# Patient Record
Sex: Female | Born: 1960
Health system: Southern US, Community
[De-identification: ages and names within clinical notes are randomized; demographics above are authoritative.]

## PROBLEM LIST (undated history)

## (undated) DIAGNOSIS — I739 Peripheral vascular disease, unspecified: Secondary | ICD-10-CM

## (undated) DIAGNOSIS — F1011 Alcohol abuse, in remission: Secondary | ICD-10-CM

## (undated) DIAGNOSIS — F1911 Other psychoactive substance abuse, in remission: Secondary | ICD-10-CM

## (undated) DIAGNOSIS — E7800 Pure hypercholesterolemia, unspecified: Secondary | ICD-10-CM

## (undated) DIAGNOSIS — E119 Type 2 diabetes mellitus without complications: Secondary | ICD-10-CM

## (undated) DIAGNOSIS — J449 Chronic obstructive pulmonary disease, unspecified: Secondary | ICD-10-CM

## (undated) DIAGNOSIS — N289 Disorder of kidney and ureter, unspecified: Secondary | ICD-10-CM

## (undated) DIAGNOSIS — I10 Essential (primary) hypertension: Secondary | ICD-10-CM

## (undated) DIAGNOSIS — J4489 Other specified chronic obstructive pulmonary disease: Secondary | ICD-10-CM

## (undated) DIAGNOSIS — I509 Heart failure, unspecified: Secondary | ICD-10-CM

---

## 2013-08-02 DIAGNOSIS — E785 Hyperlipidemia, unspecified: Secondary | ICD-10-CM | POA: Insufficient documentation

## 2013-08-02 DIAGNOSIS — I10 Essential (primary) hypertension: Secondary | ICD-10-CM | POA: Insufficient documentation

## 2013-08-02 DIAGNOSIS — I2510 Atherosclerotic heart disease of native coronary artery without angina pectoris: Secondary | ICD-10-CM | POA: Insufficient documentation

## 2013-08-02 DIAGNOSIS — E119 Type 2 diabetes mellitus without complications: Secondary | ICD-10-CM | POA: Insufficient documentation

## 2015-04-27 DIAGNOSIS — J449 Chronic obstructive pulmonary disease, unspecified: Secondary | ICD-10-CM | POA: Insufficient documentation

## 2015-11-05 DIAGNOSIS — F1911 Other psychoactive substance abuse, in remission: Secondary | ICD-10-CM | POA: Insufficient documentation

## 2015-11-05 DIAGNOSIS — E669 Obesity, unspecified: Secondary | ICD-10-CM | POA: Insufficient documentation

## 2016-01-13 DIAGNOSIS — F17210 Nicotine dependence, cigarettes, uncomplicated: Secondary | ICD-10-CM | POA: Insufficient documentation

## 2016-03-19 ENCOUNTER — Encounter (HOSPITAL_BASED_OUTPATIENT_CLINIC_OR_DEPARTMENT_OTHER): Payer: Self-pay | Admitting: *Deleted

## 2016-03-19 ENCOUNTER — Emergency Department (HOSPITAL_BASED_OUTPATIENT_CLINIC_OR_DEPARTMENT_OTHER): Payer: Medicaid Other

## 2016-03-19 ENCOUNTER — Emergency Department (HOSPITAL_BASED_OUTPATIENT_CLINIC_OR_DEPARTMENT_OTHER)
Admission: EM | Admit: 2016-03-19 | Discharge: 2016-03-19 | Disposition: A | Discharge status: Home / Self Care | Payer: Medicaid Other | Attending: Emergency Medicine | Admitting: Emergency Medicine

## 2016-03-19 DIAGNOSIS — R51 Headache: Secondary | ICD-10-CM | POA: Insufficient documentation

## 2016-03-19 DIAGNOSIS — Z79899 Other long term (current) drug therapy: Secondary | ICD-10-CM | POA: Diagnosis not present

## 2016-03-19 DIAGNOSIS — Z7982 Long term (current) use of aspirin: Secondary | ICD-10-CM | POA: Diagnosis not present

## 2016-03-19 DIAGNOSIS — H9201 Otalgia, right ear: Secondary | ICD-10-CM | POA: Diagnosis present

## 2016-03-19 DIAGNOSIS — J449 Chronic obstructive pulmonary disease, unspecified: Secondary | ICD-10-CM | POA: Diagnosis not present

## 2016-03-19 DIAGNOSIS — E119 Type 2 diabetes mellitus without complications: Secondary | ICD-10-CM | POA: Diagnosis not present

## 2016-03-19 DIAGNOSIS — Z7984 Long term (current) use of oral hypoglycemic drugs: Secondary | ICD-10-CM | POA: Insufficient documentation

## 2016-03-19 DIAGNOSIS — F17200 Nicotine dependence, unspecified, uncomplicated: Secondary | ICD-10-CM | POA: Diagnosis not present

## 2016-03-19 DIAGNOSIS — I10 Essential (primary) hypertension: Secondary | ICD-10-CM | POA: Insufficient documentation

## 2016-03-19 HISTORY — DX: Peripheral vascular disease, unspecified: I73.9

## 2016-03-19 HISTORY — DX: Other specified chronic obstructive pulmonary disease: J44.89

## 2016-03-19 HISTORY — DX: Chronic obstructive pulmonary disease, unspecified: J44.9

## 2016-03-19 HISTORY — DX: Pure hypercholesterolemia, unspecified: E78.00

## 2016-03-19 HISTORY — DX: Type 2 diabetes mellitus without complications: E11.9

## 2016-03-19 HISTORY — DX: Other psychoactive substance abuse, in remission: F19.11

## 2016-03-19 HISTORY — DX: Essential (primary) hypertension: I10

## 2016-03-19 HISTORY — DX: Alcohol abuse, in remission: F10.11

## 2016-03-19 LAB — CBC WITH DIFFERENTIAL/PLATELET
Basophils Absolute: 0 10*3/uL (ref 0.0–0.1)
Basophils Relative: 0 %
Eosinophils Absolute: 0.3 10*3/uL (ref 0.0–0.7)
Eosinophils Relative: 3 %
HCT: 35.6 % — ABNORMAL LOW (ref 36.0–46.0)
Hemoglobin: 11.9 g/dL — ABNORMAL LOW (ref 12.0–15.0)
Lymphocytes Relative: 46 %
Lymphs Abs: 3.9 10*3/uL (ref 0.7–4.0)
MCH: 28 pg (ref 26.0–34.0)
MCHC: 33.4 g/dL (ref 30.0–36.0)
MCV: 83.8 fL (ref 78.0–100.0)
Monocytes Absolute: 0.5 10*3/uL (ref 0.1–1.0)
Monocytes Relative: 6 %
Neutro Abs: 3.8 10*3/uL (ref 1.7–7.7)
Neutrophils Relative %: 45 %
Platelets: 322 10*3/uL (ref 150–400)
RBC: 4.25 MIL/uL (ref 3.87–5.11)
RDW: 14.1 % (ref 11.5–15.5)
WBC: 8.5 10*3/uL (ref 4.0–10.5)

## 2016-03-19 LAB — BASIC METABOLIC PANEL
Anion gap: 6 (ref 5–15)
BUN: 25 mg/dL — ABNORMAL HIGH (ref 6–20)
CO2: 22 mmol/L (ref 22–32)
Calcium: 9.4 mg/dL (ref 8.9–10.3)
Chloride: 108 mmol/L (ref 101–111)
Creatinine, Ser: 1.07 mg/dL — ABNORMAL HIGH (ref 0.44–1.00)
GFR calc Af Amer: >60 mL/min (ref >60)
GFR calc non Af Amer: 58 mL/min — ABNORMAL LOW (ref >60)
Glucose, Bld: 252 mg/dL — ABNORMAL HIGH (ref 65–99)
Potassium: 4 mmol/L (ref 3.5–5.1)
Sodium: 136 mmol/L (ref 135–145)

## 2016-03-19 MED ORDER — IOPAMIDOL (ISOVUE-300) INJECTION 61%
75.0000 mL | Freq: Once | INTRAVENOUS | Status: AC | PRN
  Administered 2016-03-19: 75 mL via INTRAVENOUS

## 2016-03-19 MED ORDER — AMOXICILLIN-POT CLAVULANATE 875-125 MG PO TABS: 1.0000 | ORAL_TABLET | Freq: Two times a day (BID) | ORAL | 0 refills | Status: DC

## 2016-03-19 MED ORDER — OXYCODONE-ACETAMINOPHEN 5-325 MG PO TABS: 1.0000 | ORAL_TABLET | Freq: Once | ORAL | Status: DC

## 2016-03-19 MED ORDER — IBUPROFEN 800 MG PO TABS
800.0000 mg | ORAL_TABLET | Freq: Once | ORAL | Status: AC
  Administered 2016-03-19: 800 mg via ORAL
  Filled 2016-03-19: qty 1

## 2016-03-19 NOTE — ED Triage Notes (Signed)
Pt reports R ear pain, intermittent blurry vision (R eye), intermittent dizziness and lightheadedness x1wk. Reports seeing PCP this past Wed -- states he told her it was an ear infection that was likely pressing on optic nerve. States he gave her ear drops but that her symptoms have worsened since then. Denies fever, n/v/d, nasal congestion/discharge. Reports sore throat.

## 2016-03-19 NOTE — Discharge Instructions (Signed)
Please take antibiotic as prescribed. Please follow up with your primary care doctor this week. You also need your kidney function rechecked by your primary care doctor. Return to the ED if you symptoms worsen or if your vision gets worse.

## 2016-03-19 NOTE — ED Provider Notes (Signed)
[facility]   CSN: [id number] Arrival date & time: 03/19/16  7829     History   Chief Complaint Chief Complaint  Patient presents with  . Otalgia    HPI Kathryn Dillon is a 55 y.o. female.  55 year old African-American female past history significant for COPD, diabetes, hypertension presents to the ED this morning with right ear pain. Patient states that she was diagnosed with otitis media approximately 4 days ago by PCP. She was given antibiotic drops. However she states that the pain has gotten significantly worse this morning. She is unable to sleep at night due to the pain. Patient states the right ear pain as been ongoing for 10 days. Nothing makes better or worse. She also endorses intermittent right blurry vision. Along with intermittent dizziness and lightheadedness for the past 10 days since the otalgia started. Denies vertigo like symptoms. She also reports a mild constant right sided headache. Patient endorses right maxillary and frontal sinus pressure. She states she thought it was allergies. Endorses mild sore throat. Denies any fever, chills, rhinorrhea, ear discharge, or hearing loss, cough, chest congestion, CP, SOB, nausea, emesis, abd pain, urinary symptoms, change in bowel habits, numbness/tingling,       Past Medical History:  Diagnosis Date  . COPD (chronic obstructive pulmonary disease) with chronic bronchitis (HCC)   . Diabetes mellitus without complication (HCC)   . H/O drug abuse   . H/O ETOH abuse   . High cholesterol   . Hypertension   . PAD (peripheral artery disease) (HCC)     There are no active problems to display for this patient.   History reviewed. No pertinent surgical history.  OB History    No data available       Home Medications    Prior to Admission medications   Medication Sig Start Date End Date Taking? Authorizing Provider  AMLODIPINE BESYLATE PO Take 5 mg by mouth daily.   Yes Historical Provider,  MD  aspirin EC 81 MG tablet Take 81 mg by mouth daily.   Yes Historical Provider, MD  atorvastatin (LIPITOR) 20 MG tablet Take 20 mg by mouth daily.   Yes Historical Provider, MD  cilostazol (PLETAL) 50 MG tablet Take 100 mg by mouth 2 (two) times daily.   Yes Historical Provider, MD  ezetimibe (ZETIA) 10 MG tablet Take 10 mg by mouth daily.   Yes Historical Provider, MD  gabapentin (NEURONTIN) 300 MG capsule Take 300 mg by mouth as needed (1-4 times daily).   Yes Historical Provider, MD  hydrochlorothiazide (HYDRODIURIL) 25 MG tablet Take 25 mg by mouth daily.   Yes Historical Provider, MD  loratadine (CLARITIN) 10 MG tablet Take 10 mg by mouth daily.   Yes Historical Provider, MD  losartan (COZAAR) 50 MG tablet Take 50 mg by mouth daily.   Yes Historical Provider, MD  metFORMIN (GLUCOPHAGE) 1000 MG tablet Take 1,000 mg by mouth 2 (two) times daily with a meal.   Yes Historical Provider, MD  sertraline (ZOLOFT) 25 MG tablet Take 25 mg by mouth daily.   Yes Historical Provider, MD    Family History No family history on file.  Social History Social History  Substance Use Topics  . Smoking status: Current Every Day Smoker  . Smokeless tobacco: Never Used  . Alcohol use No     Allergies   Lisinopril   Review of Systems Review of Systems  Constitutional: Negative for chills and fever.  HENT: Positive for congestion, ear  pain, sinus pressure and sore throat. Negative for ear discharge, facial swelling, hearing loss and rhinorrhea.   Eyes: Negative for pain and visual disturbance.  Respiratory: Negative for cough and shortness of breath.   Cardiovascular: Negative for chest pain and palpitations.  Gastrointestinal: Negative for abdominal pain, diarrhea, nausea and vomiting.  Genitourinary: Negative for dysuria, frequency, hematuria and urgency.  Musculoskeletal: Negative for arthralgias and back pain.  Skin: Negative for color change and rash.  Neurological: Positive for  light-headedness and headaches. Negative for dizziness, syncope, weakness and numbness.  All other systems reviewed and are negative.    Physical Exam Updated Vital Signs BP 127/74 (BP Location: Right Arm)   Pulse 83   Temp 98.7 F (37.1 C) (Oral)   Resp 18   Ht 5\' 4"  (1.626 m)   Wt 108 kg   SpO2 98%   BMI 40.85 kg/m   Physical Exam  Constitutional: She appears well-developed and well-nourished. No distress.  HENT:  Head: Normocephalic and atraumatic.  Right Ear: Hearing, tympanic membrane and external ear normal. There is swelling and tenderness. No drainage.  Left Ear: Hearing, tympanic membrane, external ear and ear canal normal.  Nose: Right sinus exhibits maxillary sinus tenderness and frontal sinus tenderness. Left sinus exhibits frontal sinus tenderness. Left sinus exhibits no maxillary sinus tenderness.  Mouth/Throat: Oropharynx is clear and moist.  Minimal swelling around right ear, with tenderness to palpation behind ear, slight mastoid tenderness  Eyes: Conjunctivae and EOM are normal. Pupils are equal, round, and reactive to light. Right eye exhibits no discharge. Left eye exhibits no discharge. No scleral icterus.  No proptosis, no temporal tenderness, peripheral vision intact. EOM intact.   Neck: Normal range of motion. Neck supple. No thyromegaly present.  Cardiovascular: Normal rate, regular rhythm, normal heart sounds and intact distal pulses.   Pulmonary/Chest: Effort normal and breath sounds normal.  Abdominal: Soft. Bowel sounds are normal. She exhibits no distension. There is no tenderness. There is no guarding.  Musculoskeletal: Normal range of motion.  Lymphadenopathy:    She has cervical adenopathy.  Neurological: She is alert. She has normal strength. No cranial nerve deficit or sensory deficit.  Skin: Skin is warm and dry.  Vitals reviewed.    Visual Acuity  Right Eye Distance: 20/25 Left Eye Distance: 20/25 Bilateral Distance: 20/20  Right Eye  Near:   Left Eye Near:    Bilateral Near:      ED Treatments / Results  Labs (all labs ordered are listed, but only abnormal results are displayed) Labs Reviewed  BASIC METABOLIC PANEL - Abnormal; Notable for the following:       Result Value   Glucose, Bld 252 (*)    BUN 25 (*)    Creatinine, Ser 1.07 (*)    GFR calc non Af Amer 58 (*)    All other components within normal limits  CBC WITH DIFFERENTIAL/PLATELET - Abnormal; Notable for the following:    Hemoglobin 11.9 (*)    HCT 35.6 (*)    All other components within normal limits    EKG  EKG Interpretation None       Radiology Ct Head W Or Wo Contrast  Result Date: 03/19/2016 CLINICAL DATA:  RIGHT ear pain, dizziness, and headache. Symptoms for 1 week. EXAM: CT HEAD WITHOUT AND WITH CONTRAST TECHNIQUE: Contiguous axial images were obtained from the base of the skull through the vertex without and with intravenous contrast CONTRAST:  75mL ISOVUE-300 IOPAMIDOL (ISOVUE-300) INJECTION 61% COMPARISON:  None.  FINDINGS: Brain: No evidence for acute infarction, hemorrhage, mass lesion, hydrocephalus, or extra-axial fluid. Normal cerebral volume. Hypoattenuation of white matter suggesting chronic microvascular ischemic change. Post infusion, no abnormal enhancement of the brain or visible meninges. Partial empty sella. Vascular: No hyperdense vessel. Mild vascular calcification in the carotid siphons. Skull: No worrisome osseous lesion.  No skull fracture. Sinuses/Orbits: Unremarkable Other: No temporal bone inflammatory process is evident. IMPRESSION: Negative exam. No acute intracranial findings. No abnormal postcontrast enhancement. Mild chronic microvascular ischemic change, likely sequelae of hypertension and/or diabetes. Electronically Signed   By: Elsie Stain M.D.   On: 03/19/2016 11:04   Ct Soft Tissue Neck W Contrast  Result Date: 03/19/2016 CLINICAL DATA:  RIGHT ear pain and dizziness.  Symptoms for 1 week. EXAM: CT NECK  WITH CONTRAST TECHNIQUE: Multidetector CT imaging of the neck was performed using the standard protocol following the bolus administration of intravenous contrast. CONTRAST:  75mL ISOVUE-300 IOPAMIDOL (ISOVUE-300) INJECTION 61% COMPARISON:  CT head reported separately. FINDINGS: Pharynx and larynx: Normal. No mass or swelling. Salivary glands: No inflammation, mass, or stone. Thyroid: Normal. Lymph nodes: None enlarged or abnormal density. Vascular: Negative. Limited intracranial: Negative. Visualized orbits: Negative. Mastoids and visualized paranasal sinuses: Clear. Skeleton: No acute or aggressive process. Upper chest: Negative. Other: None. IMPRESSION: Unremarkable CT neck. Electronically Signed   By: Elsie Stain M.D.   On: 03/19/2016 10:59    Procedures Procedures (including critical care time)  Medications Ordered in ED Medications  oxyCODONE-acetaminophen (PERCOCET/ROXICET) 5-325 MG per tablet 1 tablet (not administered)     Initial Impression / Assessment and Plan / ED Course  I have reviewed the triage vital signs and the nursing notes.  Pertinent labs & imaging results that were available during my care of the patient were reviewed by me and considered in my medical decision making (see chart for details).  Clinical Course  Patient presented to ED with right ear pain, dizziness, and blurry vision. Patient has failed outpatient abx therapy. Concern for mastoiditis given history of DM. Also concern for cavernous sinus thrombosis given her blurry vision. Low suspicion for temporal arteritis given age, no temporal tenderness, normal visual acuity and length of symptoms. CT scan performed was unremarkable and showed not acute findings. Labs unremarkable. Patient without leukocytosis. Creatine mildly elevated. Encouraged patient to follow up with pcp for recheck. Dizziness likely due to mild dehydration. Encouraged po intake. Patient is not toxic appearing. And pain was treated in ED. Likely  AOM. Will treat with Augmentin. Patient encouraged to follow up with PCP if symptoms persists. Discussed plan of care with Dr. Clayborne Dana who saw and examined patient and agrees with plan. Hemodynamically stable. Discharged home in NAD with stable vs. Patient verbalized understanding with plan of care.  Final Clinical Impressions(s) / ED Diagnoses   Final diagnoses:  Otalgia of right ear    New Prescriptions Discharge Medication List as of 03/19/2016 12:11 PM    START taking these medications   Details  amoxicillin-clavulanate (AUGMENTIN) 875-125 MG tablet Take 1 tablet by mouth 2 (two) times daily., Starting Sat 03/19/2016, Print         Rise Mu, PA-C 03/21/16 1610    Marily Memos, MD 03/21/16 1102

## 2016-03-19 NOTE — ED Notes (Signed)
PA-C at bedside 

## 2016-03-19 NOTE — ED Provider Notes (Signed)
Medical screening examination/treatment/procedure(s) were conducted as a shared visit with non-physician practitioner(s) and myself.  I personally evaluated the patient during the encounter.  55 yo F w/ h/o DM here w/ 10 days of right ear pain, now with swelling and increasing pain. Also with blurry vision. Seen by PCP, given drops without relief.  Here with pain, tenderness around ear, no proptosis. TM with some erythema, minimal fluid.  Likely AOM, but not impressive presentation, so further on differential includes mastoiditis and cavernous sinus thrombosis secondary to blurry vision. I think these are low likelihood so we will do CT scan and if it is absolutely normal I will not pursue MRI and likely just start antibiotics.   Marily MemosJason Zarie Kosiba, MD 03/19/16 [id number]

## 2016-03-23 DIAGNOSIS — I70213 Atherosclerosis of native arteries of extremities with intermittent claudication, bilateral legs: Secondary | ICD-10-CM | POA: Insufficient documentation

## 2016-03-30 ENCOUNTER — Encounter: Payer: Self-pay | Admitting: Podiatry

## 2016-03-30 ENCOUNTER — Ambulatory Visit (INDEPENDENT_AMBULATORY_CARE_PROVIDER_SITE_OTHER): Payer: Medicaid Other | Admitting: Podiatry

## 2016-03-30 VITALS — BP 137/87 | HR 93 | Ht 64.0 in | Wt 242.0 lb

## 2016-03-30 DIAGNOSIS — I739 Peripheral vascular disease, unspecified: Secondary | ICD-10-CM

## 2016-03-30 DIAGNOSIS — M79673 Pain in unspecified foot: Secondary | ICD-10-CM | POA: Diagnosis not present

## 2016-03-30 DIAGNOSIS — B351 Tinea unguium: Secondary | ICD-10-CM | POA: Diagnosis not present

## 2016-03-30 DIAGNOSIS — L600 Ingrowing nail: Secondary | ICD-10-CM | POA: Insufficient documentation

## 2016-03-30 NOTE — Progress Notes (Addendum)
SUBJECTIVE: 55 y.o. year old female presents for diabetic foot care. Patient request toe nails trimmed. They are real sore on both big toes from ingrown nails. Also wants to have diabetic shoes. Diagnosed with diabetic in 2006. Blood sugar was 111 this morning. Last A1C was 9 last month. Patient is ambulatory without assistance.  She walks 30 minutes daily but gets pain in calf after a few blocks.   HPI: Has blockage in both leg since early 2017. In process of making arrangement for possible reconstructive procedure. Also numbness, tingling and stinging on toes and bottom of feet.  REVIEW OF SYSTEMS: Constitutional: negative for chills, fatigue, fevers, night sweats and weight loss Eyes: negative Ears, nose, mouth, throat, and face: negative Respiratory: negative, COPD, uses O2 and Nebulizer. Cardiovascular: Hypertension. Gastrointestinal: negative Genitourinary:negative Hematologic/lymphatic: negative Musculoskeletal:negative Neurological: Subjective numbness, tingling, and burning sensation on toes and feet bilateral.  Endocrine: IDDM. Allergic/Immunologic: negative  OBJECTIVE: DERMATOLOGIC EXAMINATION: Thick hypertrophic nails x 10. Symptomatic ingrown nails on both great toes.  VASCULAR EXAMINATION OF LOWER LIMBS: Pedal pulses are not palpable bilateral.  Temperature gradient from tibial crest to dorsum of foot is within normal bilateral. No sign of ischemic skin change noted.  NEUROLOGIC EXAMINATION OF THE LOWER LIMBS: Achilles DTR is present and within normal. Monofilament (Semmes-Weinstein 10-gm) sensory testing show diminished response. Failed to respond in lesser digits, 1 out of 5 on each foot. 2. Vibratory sensations(128Hz  turning fork) intact at medial and lateral forefoot bilateral.  Sharp and Dull discriminatory sensations at the plantar ball of hallux is intact bilateral.   MUSCULOSKELETAL EXAMINATION: No gross deformities noted. Pain in calf after walking a  few blocks away bilateral.  ASSESSMENT: Painful ingrown nails on both great toes. Mycotic nails x 10. PVD lower limbs with claudication. Uncontrolled IDDM.  PLAN: Reviewed clinical findings and available options. All nails debrided. Pain was relieved. Reviewed importance of the vascular work up that is scheduled in near future with his vascular doctor.  Continue with daily walking exercise to promote blood flow in lower limbs. Diabetic shoe form dispensed for him to take to PCP for certification.  Return in 3 months or as needed.

## 2016-03-30 NOTE — Patient Instructions (Addendum)
Seen for painful toe nails on both great toes and diabetic shoes. Currently both lower limbs have compromised blood flow and Peripheral neuropathy.  All affected nails debrided. Continue with daily walking exercise.  May benefit from diabetic shoes.  Return in 3 months or sooner if the nails become painful again.

## 2016-06-29 ENCOUNTER — Ambulatory Visit: Payer: Medicaid Other | Admitting: Podiatry

## 2016-07-18 ENCOUNTER — Encounter (HOSPITAL_BASED_OUTPATIENT_CLINIC_OR_DEPARTMENT_OTHER): Payer: Self-pay | Admitting: *Deleted

## 2016-07-18 ENCOUNTER — Emergency Department (HOSPITAL_BASED_OUTPATIENT_CLINIC_OR_DEPARTMENT_OTHER)
Admission: EM | Admit: 2016-07-18 | Discharge: 2016-07-18 | Disposition: A | Discharge status: Home / Self Care | Payer: No Typology Code available for payment source | Attending: Emergency Medicine | Admitting: Emergency Medicine

## 2016-07-18 ENCOUNTER — Emergency Department (HOSPITAL_BASED_OUTPATIENT_CLINIC_OR_DEPARTMENT_OTHER): Payer: No Typology Code available for payment source

## 2016-07-18 DIAGNOSIS — Z7984 Long term (current) use of oral hypoglycemic drugs: Secondary | ICD-10-CM | POA: Diagnosis not present

## 2016-07-18 DIAGNOSIS — Y92410 Unspecified street and highway as the place of occurrence of the external cause: Secondary | ICD-10-CM | POA: Diagnosis not present

## 2016-07-18 DIAGNOSIS — Z79899 Other long term (current) drug therapy: Secondary | ICD-10-CM | POA: Insufficient documentation

## 2016-07-18 DIAGNOSIS — Y9389 Activity, other specified: Secondary | ICD-10-CM | POA: Diagnosis not present

## 2016-07-18 DIAGNOSIS — E119 Type 2 diabetes mellitus without complications: Secondary | ICD-10-CM | POA: Diagnosis not present

## 2016-07-18 DIAGNOSIS — S4992XA Unspecified injury of left shoulder and upper arm, initial encounter: Secondary | ICD-10-CM | POA: Diagnosis present

## 2016-07-18 DIAGNOSIS — S8002XA Contusion of left knee, initial encounter: Secondary | ICD-10-CM | POA: Diagnosis not present

## 2016-07-18 DIAGNOSIS — J449 Chronic obstructive pulmonary disease, unspecified: Secondary | ICD-10-CM | POA: Diagnosis not present

## 2016-07-18 DIAGNOSIS — Y999 Unspecified external cause status: Secondary | ICD-10-CM | POA: Diagnosis not present

## 2016-07-18 DIAGNOSIS — Z7982 Long term (current) use of aspirin: Secondary | ICD-10-CM | POA: Diagnosis not present

## 2016-07-18 DIAGNOSIS — S42145A Nondisplaced fracture of glenoid cavity of scapula, left shoulder, initial encounter for closed fracture: Secondary | ICD-10-CM

## 2016-07-18 DIAGNOSIS — F17200 Nicotine dependence, unspecified, uncomplicated: Secondary | ICD-10-CM | POA: Diagnosis not present

## 2016-07-18 DIAGNOSIS — I10 Essential (primary) hypertension: Secondary | ICD-10-CM | POA: Insufficient documentation

## 2016-07-18 MED ORDER — HYDROCODONE-ACETAMINOPHEN 5-325 MG PO TABS
1.0000 | ORAL_TABLET | Freq: Four times a day (QID) | ORAL | 0 refills | Status: DC | PRN
Start: 2016-07-18 — End: 2016-08-02

## 2016-07-18 MED ORDER — HYDROCODONE-ACETAMINOPHEN 5-325 MG PO TABS
1.0000 | ORAL_TABLET | Freq: Once | ORAL | Status: AC
  Administered 2016-07-18: 1 via ORAL
  Filled 2016-07-18: qty 1
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
## 2016-07-26 ENCOUNTER — Ambulatory Visit: Payer: Medicaid Other | Admitting: Family Medicine

## 2016-08-02 ENCOUNTER — Ambulatory Visit (INDEPENDENT_AMBULATORY_CARE_PROVIDER_SITE_OTHER): Payer: Medicaid Other | Admitting: Family Medicine

## 2016-08-02 ENCOUNTER — Encounter: Payer: Self-pay | Admitting: Family Medicine

## 2016-08-02 ENCOUNTER — Ambulatory Visit (HOSPITAL_BASED_OUTPATIENT_CLINIC_OR_DEPARTMENT_OTHER)
Admission: RE | Admit: 2016-08-02 | Discharge: 2016-08-02 | Disposition: A | Discharge status: Home / Self Care | Payer: No Typology Code available for payment source | Source: Ambulatory Visit | Attending: Family Medicine | Admitting: Family Medicine

## 2016-08-02 VITALS — BP 138/84 | HR 92 | Ht 64.0 in | Wt 238.0 lb

## 2016-08-02 DIAGNOSIS — M25512 Pain in left shoulder: Secondary | ICD-10-CM | POA: Diagnosis not present

## 2016-08-02 DIAGNOSIS — S4992XA Unspecified injury of left shoulder and upper arm, initial encounter: Secondary | ICD-10-CM | POA: Diagnosis not present

## 2016-08-02 MED ORDER — HYDROCODONE-ACETAMINOPHEN 5-325 MG PO TABS: 1.0000 | ORAL_TABLET | ORAL | 0 refills | Status: DC | PRN

## 2016-08-02 MED ORDER — DICLOFENAC SODIUM 75 MG PO TBEC: 75.0000 mg | DELAYED_RELEASE_TABLET | Freq: Two times a day (BID) | ORAL | 1 refills | Status: DC
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
## 2016-08-03 DIAGNOSIS — S4992XD Unspecified injury of left shoulder and upper arm, subsequent encounter: Secondary | ICD-10-CM | POA: Insufficient documentation

## 2016-08-03 MED ORDER — MELOXICAM 15 MG PO TABS: 15.0000 mg | ORAL_TABLET | Freq: Every day | ORAL | 2 refills | Status: DC
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
## 2016-08-03 NOTE — Assessment & Plan Note (Signed)
independently reviewed repeat radiographs.  No displacement of her glenoid fracture.  This should heal well with conservative treatment.  Start physical therapy and do home exercises to focus on regaining motion then hopefully work up to strengthening.  Voltaren with norco as needed.  F/u in 4 weeks.  If not improving as expected would consider MRI at that time.

## 2016-08-09 ENCOUNTER — Ambulatory Visit: Payer: No Typology Code available for payment source | Attending: Family Medicine | Admitting: Physical Therapy

## 2016-08-09 DIAGNOSIS — M25512 Pain in left shoulder: Secondary | ICD-10-CM | POA: Insufficient documentation

## 2016-08-09 DIAGNOSIS — R293 Abnormal posture: Secondary | ICD-10-CM | POA: Diagnosis present

## 2016-08-09 DIAGNOSIS — M6281 Muscle weakness (generalized): Secondary | ICD-10-CM | POA: Insufficient documentation
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
## 2016-08-09 NOTE — Patient Instructions (Signed)
Cane Exercise: Flexion    Lie on back, holding cane above chest. Keeping arms as straight as possible, lower cane toward floor beyond head. Hold __5-10__ seconds. Repeat _15_ times. Do __2__ sessions per day.    Cane Exercise: Abduction    Hold cane with right hand over end, palm-up, with other hand palm-down. Move arm out from side and up by pushing with other arm. Hold __5-10__ seconds. Repeat _15___ times. Do _2___ sessions per day.    Flexion (Isometric)    Press right fist against wall. Hold __5-10__ seconds. Repeat __15__ times. Do __2__ sessions per day.   SHOULDER: Abduction (Isometric)    Use wall as resistance. Press arm against pillow. Keep elbow straight. Hold _5-10__ seconds. _15__ reps per set, _2__ sets per day   Internal Rotation (Isometric)    Place palm of right fist against door frame, with elbow bent. Press fist against door frame. Hold __5-10__ seconds. Repeat _15___ times. Do __2__ sessions per day.   External Rotation (Isometric)    Place back of left fist against door frame, with elbow bent. Press fist against door frame. Hold __5-10__ seconds. Repeat __15__ times. Do _2___ sessions per day.

## 2016-08-12 ENCOUNTER — Ambulatory Visit: Payer: No Typology Code available for payment source

## 2016-08-17 ENCOUNTER — Ambulatory Visit: Payer: No Typology Code available for payment source | Admitting: Rehabilitative and Restorative Service Providers"

## 2016-08-17 ENCOUNTER — Encounter: Payer: Self-pay | Admitting: Rehabilitative and Restorative Service Providers"

## 2016-08-17 DIAGNOSIS — R293 Abnormal posture: Secondary | ICD-10-CM

## 2016-08-17 DIAGNOSIS — M25512 Pain in left shoulder: Secondary | ICD-10-CM | POA: Diagnosis not present

## 2016-08-17 DIAGNOSIS — M6281 Muscle weakness (generalized): Secondary | ICD-10-CM

## 2016-08-17 NOTE — Patient Instructions (Addendum)
Shoulder Blade Squeeze    Rotate shoulders back, then squeeze shoulder blades down and back. Hold 10 sec Repeat _10___ times. Do __several__ sessions per day.   Lat Stretch, Standing    Stand and place palms on counter. Step back allowing arm to bend. Hold __10_ seconds. Repeat _10__ times per session. Do ___ sessions per day.   Flexors Stretch, Sitting (Passive)    Sit, forearm on table. Bend from waist and slide forearm forward along table. Hold _10__ seconds.  Repeat __10_ times per session. Do __2-3_ sessions per day.   Anterior Capsule Stretch, Standing    Stand holding stick behind back, hands palms up. Lift stick away from body as far as possible. Hold _10__ seconds. Repeat _5__ times per session. Do _2-3__ sessions per day.     External Rotator Cuff Stretch, Standing (Passive)    Lie supine, one elbow against ribs, and bent at 90, dowel in palm, other hand holding dowel up. Use other arm to push forearm toward floor. Keep elbow against side. Hold __10_ seconds. Repeat _10__ times per session. Do _2-3__ sessions per day.  TENS UNIT: This is helpful for muscle pain and spasm.   Search and Purchase a TENS 7000 2nd edition at www.tenspros.com. It should be less than $30.     TENS unit instructions: Do not shower or bathe with the unit on Turn the unit off before removing electrodes or batteries If the electrodes lose stickiness add a drop of water to the electrodes after they are disconnected from the unit and place on plastic sheet. If you continued to have difficulty, call the TENS unit company to purchase more electrodes. Do not apply lotion on the skin area prior to use. Make sure the skin is clean and dry as this will help prolong the life of the electrodes. After use, always check skin for unusual red areas, rash or other skin difficulties. If there are any skin problems, does not apply electrodes to the same area. Never remove the electrodes from the  unit by pulling the wires. Do not use the TENS unit or electrodes other than as directed. Do not change electrode placement without consultating your therapist or physician. Keep 2 fingers with between each electrode.

## 2016-08-17 NOTE — Therapy (Signed)
[facility] [place] [address]  [address] [place], [place], [address] Phone: [phone]   Fax:  [phone]  Physical Therapy Treatment  Patient Details  Name: Kathryn BlockerRonda Dillon MRN: [id number] Date of Birth: 10-Jan-1961 Referring Provider: Dr Norton BlizzardShane Hudnall  Encounter Date: 08/17/2016      PT End of Session - 08/17/16 0929    Visit Number 2   Number of Visits 16   Date for PT Re-Evaluation 10/04/16   PT Start Time 0929   PT Stop Time 1027   PT Time Calculation (min) 58 min   Activity Tolerance Patient tolerated treatment well      Past Medical History:  Diagnosis Date  . COPD (chronic obstructive pulmonary disease) with chronic bronchitis (HCC)   . Diabetes mellitus without complication (HCC)   . H/O drug abuse   . H/O ETOH abuse   . High cholesterol   . Hypertension   . PAD (peripheral artery disease) (HCC)     History reviewed. No pertinent surgical history.  There were no vitals filed for this visit.      Subjective Assessment - 08/17/16 0930    Subjective Shoulder continues to be painful. She is trying to use the Lt arm for functional activities which has increased the pain. She is out of the sling and not using the pain meds much.    Currently in Pain? Yes   Pain Score 8    Pain Location Shoulder   Pain Orientation Left   Pain Descriptors / Indicators Sore;Aching   Pain Type Acute pain   Pain Onset 1 to 4 weeks ago   Pain Frequency Intermittent   Aggravating Factors  ovement using Lt UE    Pain Relieving Factors heat; rest             [facility] - 08/17/16 0001      Assessment   Medical Diagnosis Acute pain of L shoulder    Referring Provider Dr Norton BlizzardShane Hudnall   Onset Date/Surgical Date 07/15/16   Hand Dominance Right   Next MD Visit 4 weeks   Prior Therapy no     AROM   AROM Assessment Site Shoulder   Right/Left Shoulder Left   Left Shoulder Flexion 124 Degrees  some discomfort    Left Shoulder ABduction 125 Degrees  some discomfort      PROM   PROM Assessment Site Shoulder  pt supine    Right/Left Shoulder Left   Left Shoulder Flexion 165 Degrees   Left Shoulder ABduction 173 Degrees   Left Shoulder External Rotation 93 Degrees                     [facility] Adult PT Treatment/Exercise - 08/17/16 0001      Therapeutic Activites    Therapeutic Activities --  instruced in myofacial ball release work with 4 in ball      Neuro Re-ed    Neuro Re-ed Details  working on posture and alignment encouraging upright posture engaging posterior shoudler girdle musculature      Shoulder Exercises: Standing   Other Standing Exercises pendulum 30 CW/ 30 CCw    Other Standing Exercises scap squeeze with noodle 10 sec x 10      Shoulder Exercises: Pulleys   Flexion --  10 sec hold x 10 reps      Shoulder Exercises: Stretch   External Rotation Stretch --  10 sec x 10 standing w/ noodle w/ cane  Table Stretch - Flexion --  10 reps x 10 sec    Other Shoulder Stretches stepping back UE's resting on counter 10 sec x 10    Other Shoulder Stretches extension with cane 10 sec x 5     Manual Therapy   Manual therapy comments pt supine    Joint Mobilization GH joint glides Lt   Soft tissue mobilization anterior Lt shoudler through pecs and ant deltoid; upper trap/leveator    Scapular Mobilization Lt scapula    Passive ROM Lt shoudler into flexion; abduction; IR;  ER                PT Education - 08/17/16 0951    Education provided Yes   Education Details HEP; TENS info   Person(s) Educated Patient   Methods Explanation;Demonstration;Tactile cues;Verbal cues;Handout   Comprehension Verbalized understanding;Returned demonstration;Verbal cues required;Tactile cues required          PT Short Term Goals - 08/17/16 1015      PT SHORT TERM GOAL #1   Title patient to be independent with HEP (09/06/16)   Status On-going     PT SHORT TERM GOAL #2    Title patient to improve L shoulder AROM equal to that of R shoulder with no increase in pain (09/06/16)   Status On-going           PT Long Term Goals - 08/17/16 0929      PT LONG TERM GOAL #1   Title patient to be independent with advanced HEP (10/04/16)   Status On-going     PT LONG TERM GOAL #2   Title patient to demonstrate ability to maintain good postural alignment with ability to self-correct (10/04/16)   Status On-going     PT LONG TERM GOAL #3   Title patient to improve L UE strength to >/= 4+/5 with no increase in pain (10/04/16)   Status On-going     PT LONG TERM GOAL #4   Title patient to report ability ot perform household chores and ADLs with no increase in L shoulder pain (10/04/16)   Status On-going               Plan - 08/17/16 1012    Clinical Impression Statement Good gains in AROM and some gains in PROM. Continued pain. Patient reports that she is out of the sling and trying to use Lt UE for more functional activities. Cautioned against overuse. Progressing well toward stated goals of therapy.    Rehab Potential Good   PT Frequency 2x / week   PT Duration 8 weeks   PT Treatment/Interventions ADLs/Self Care Home Management;Cryotherapy;Electrical Stimulation;Iontophoresis 4mg /ml Dexamethasone;Moist Heat;Ultrasound;Therapeutic exercise;Therapeutic activities;Patient/family education;Manual techniques;Passive range of motion;Vasopneumatic Device;Taping;Dry needling   PT Next Visit Plan progress L shoulder AAROM/AROM as tolerated; manual work and modalities as indicated.    Consulted and Agree with Plan of Care Patient      Patient will benefit from skilled therapeutic intervention in order to improve the following deficits and impairments:  Decreased activity tolerance, Decreased range of motion, Decreased strength, Increased edema, Impaired UE functional use, Pain  Visit Diagnosis: Acute pain of left shoulder  Muscle weakness (generalized)  Abnormal  posture     Problem List Patient Active Problem List   Diagnosis Date Noted  . Shoulder injury, left, initial encounter 08/03/2016  . Onychocryptosis 03/30/2016  . Atherosclerosis of native artery of both lower extremities with intermittent claudication (HCC) 03/23/2016  . Cigarette smoker 01/13/2016  . Obesity (  BMI 30-39.9) 11/05/2015  . Substance abuse in remission 11/05/2015  . Chronic obstructive pulmonary disease (HCC) 04/27/2015  . Diabetes mellitus (HCC) 08/02/2013  . Essential hypertension 08/02/2013  . Hyperlipidemia 08/02/2013    Nylen Creque Rober Minion PT, MPH  08/17/2016, 10:16 AM  [facility] [address]  [address] [place], [place], [address] Phone: [phone]   Fax:  [phone]  Name: Kathryn Dillon MRN: [id number] Date of Birth: 07/12/1960

## 2016-08-19 ENCOUNTER — Ambulatory Visit: Payer: No Typology Code available for payment source | Admitting: Physical Therapy

## 2016-08-22 ENCOUNTER — Emergency Department (HOSPITAL_BASED_OUTPATIENT_CLINIC_OR_DEPARTMENT_OTHER): Payer: Medicaid Other

## 2016-08-22 ENCOUNTER — Encounter (HOSPITAL_BASED_OUTPATIENT_CLINIC_OR_DEPARTMENT_OTHER): Payer: Self-pay | Admitting: *Deleted

## 2016-08-22 ENCOUNTER — Emergency Department (HOSPITAL_BASED_OUTPATIENT_CLINIC_OR_DEPARTMENT_OTHER)
Admission: EM | Admit: 2016-08-22 | Discharge: 2016-08-22 | Disposition: A | Discharge status: Home / Self Care | Payer: Medicaid Other | Attending: Emergency Medicine | Admitting: Emergency Medicine

## 2016-08-22 DIAGNOSIS — Z794 Long term (current) use of insulin: Secondary | ICD-10-CM | POA: Insufficient documentation

## 2016-08-22 DIAGNOSIS — Y999 Unspecified external cause status: Secondary | ICD-10-CM | POA: Diagnosis not present

## 2016-08-22 DIAGNOSIS — Y939 Activity, unspecified: Secondary | ICD-10-CM | POA: Diagnosis not present

## 2016-08-22 DIAGNOSIS — W010XXA Fall on same level from slipping, tripping and stumbling without subsequent striking against object, initial encounter: Secondary | ICD-10-CM | POA: Insufficient documentation

## 2016-08-22 DIAGNOSIS — F17210 Nicotine dependence, cigarettes, uncomplicated: Secondary | ICD-10-CM | POA: Insufficient documentation

## 2016-08-22 DIAGNOSIS — Y929 Unspecified place or not applicable: Secondary | ICD-10-CM | POA: Insufficient documentation

## 2016-08-22 DIAGNOSIS — I10 Essential (primary) hypertension: Secondary | ICD-10-CM | POA: Insufficient documentation

## 2016-08-22 DIAGNOSIS — J449 Chronic obstructive pulmonary disease, unspecified: Secondary | ICD-10-CM | POA: Diagnosis not present

## 2016-08-22 DIAGNOSIS — M25512 Pain in left shoulder: Secondary | ICD-10-CM | POA: Diagnosis not present

## 2016-08-22 DIAGNOSIS — Z79899 Other long term (current) drug therapy: Secondary | ICD-10-CM | POA: Insufficient documentation

## 2016-08-22 DIAGNOSIS — Z7982 Long term (current) use of aspirin: Secondary | ICD-10-CM | POA: Insufficient documentation

## 2016-08-22 DIAGNOSIS — E119 Type 2 diabetes mellitus without complications: Secondary | ICD-10-CM | POA: Diagnosis not present

## 2016-08-22 DIAGNOSIS — S4992XA Unspecified injury of left shoulder and upper arm, initial encounter: Secondary | ICD-10-CM | POA: Diagnosis present

## 2016-08-22 DIAGNOSIS — R030 Elevated blood-pressure reading, without diagnosis of hypertension: Secondary | ICD-10-CM

## 2016-08-22 MED ORDER — HYDROCODONE-ACETAMINOPHEN 5-325 MG PO TABS: 1.0000 | ORAL_TABLET | Freq: Four times a day (QID) | ORAL | 0 refills | Status: DC | PRN

## 2016-08-22 MED FILL — HYDROCODON-APAP 5-325: 5-325 | 2 days supply | Qty: 5 | Fill #0

## 2016-08-22 NOTE — Discharge Instructions (Signed)
Keep your scheduled appointment with the sports medicine physician. Sling as needed for comfort. Follow up with your primary care physician for blood pressure recheck in the next week or two.  Return to ER for new or worsening symptoms, any additional concerns.
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
## 2016-08-23 ENCOUNTER — Ambulatory Visit: Payer: No Typology Code available for payment source | Admitting: Physical Therapy

## 2016-08-23 DIAGNOSIS — M25512 Pain in left shoulder: Secondary | ICD-10-CM

## 2016-08-23 DIAGNOSIS — M6281 Muscle weakness (generalized): Secondary | ICD-10-CM

## 2016-08-23 DIAGNOSIS — R293 Abnormal posture: Secondary | ICD-10-CM

## 2016-08-23 NOTE — Therapy (Signed)
[facility] [address]  [address] [place], [place], [address] Phone: [phone]   Fax:  [phone]  Physical Therapy Treatment  Patient Details  Name: Kathryn BlockerRonda Stevens MRN: [id number] Date of Birth: Jul 04, 1961 Referring Provider: Dr Norton BlizzardShane Hudnall  Encounter Date: 08/23/2016      PT End of Session - 08/23/16 1322    Visit Number 3   Number of Visits 16   Date for PT Re-Evaluation 10/04/16   PT Start Time 1319   PT Stop Time 1416   PT Time Calculation (min) 57 min   Activity Tolerance Patient tolerated treatment well   Behavior During Therapy [facility] for tasks assessed/performed      Past Medical History:  Diagnosis Date  . COPD (chronic obstructive pulmonary disease) with chronic bronchitis (HCC)   . Diabetes mellitus without complication (HCC)   . H/O drug abuse   . H/O ETOH abuse   . High cholesterol   . Hypertension   . PAD (peripheral artery disease) (HCC)     No past surgical history on file.  There were no vitals filed for this visit.      Subjective Assessment - 08/23/16 1321    Subjective Had a fall onto L shoulder yesterday - entire body weight - went to ED. Imaging done - no new fracture or dislocation/subluxation   Diagnostic tests Xray - closed nondisplaced fracture of glenoid cavity of L scapula   Patient Stated Goals improve function of L shoulder   Currently in Pain? Yes   Pain Score 8    Pain Location Shoulder   Pain Orientation Left   Pain Descriptors / Indicators Sore;Aching   Pain Type Acute pain   Pain Onset 1 to 4 weeks ago   Pain Frequency Intermittent   Aggravating Factors  movement   Pain Relieving Factors heat, rest                         [facility] Adult PT Treatment/Exercise - 08/23/16 1324      Shoulder Exercises: Supine   Flexion Strengthening;Left;10 reps;Weights   Shoulder Flexion Weight (lbs) 1   Flexion Limitations no pain     Shoulder Exercises: Sidelying    External Rotation Strengthening;Left;10 reps;Weights   External Rotation Weight (lbs) 1   External Rotation Limitations no pain   ABduction Strengthening;Left;10 reps;Weights   ABduction Weight (lbs) 1   ABduction Limitations no pain     Shoulder Exercises: Standing   Flexion AAROM;Left;10 reps   Flexion Limitations wall ladder - hold with eccentric lowering   ABduction AAROM;Left;10 reps   ABduction Limitations wall ladder - hold at top with eccentric lowering   Other Standing Exercises Counter walkouts - 10 x 10 sec hold   Other Standing Exercises scap squeeze - standing with 1/2 foam roller - 15 reps with 5 second hold     Shoulder Exercises: Pulleys   Flexion 3 minutes   ABduction 3 minutes     Modalities   Modalities Electrical Stimulation;Moist Heat     Moist Heat Therapy   Number Minutes Moist Heat 15 Minutes   Moist Heat Location Shoulder     Electrical Stimulation   Electrical Stimulation Location L shoulder   Electrical Stimulation Action IFC   Electrical Stimulation Parameters to tolerance   Electrical Stimulation Goals Pain     Manual Therapy   Manual therapy comments pt supine    Passive ROM left shoulder - all  directions - no pain full PROM                  PT Short Term Goals - 08/17/16 1015      PT SHORT TERM GOAL #1   Title patient to be independent with HEP (09/06/16)   Status On-going     PT SHORT TERM GOAL #2   Title patient to improve L shoulder AROM equal to that of R shoulder with no increase in pain (09/06/16)   Status On-going           PT Long Term Goals - 08/17/16 0929      PT LONG TERM GOAL #1   Title patient to be independent with advanced HEP (10/04/16)   Status On-going     PT LONG TERM GOAL #2   Title patient to demonstrate ability to maintain good postural alignment with ability to self-correct (10/04/16)   Status On-going     PT LONG TERM GOAL #3   Title patient to improve L UE strength to >/= 4+/5 with no increase  in pain (10/04/16)   Status On-going     PT LONG TERM GOAL #4   Title patient to report ability ot perform household chores and ADLs with no increase in L shoulder pain (10/04/16)   Status On-going               Plan - 08/23/16 1323    Clinical Impression Statement Patient today with reported fall onto L shoulder - required visit to ED with imaging completed. No new fracture or pathology noted related to shoulder, and patient placed in sling for comfort. Patient today with full PROM in all planes with no pain, able to demonstrate AAROM and stretching with no increase in pain, with pain reduced at end of session as compared to initially. Patient to continue to benefit from PT to maximize functional use of L UE.    PT Treatment/Interventions ADLs/Self Care Home Management;Cryotherapy;Electrical Stimulation;Iontophoresis 4mg /ml Dexamethasone;Moist Heat;Ultrasound;Therapeutic exercise;Therapeutic activities;Patient/family education;Manual techniques;Passive range of motion;Vasopneumatic Device;Taping;Dry needling   PT Next Visit Plan progress L shoulder AAROM/AROM as tolerated; manual work and modalities as indicated.    Consulted and Agree with Plan of Care Patient      Patient will benefit from skilled therapeutic intervention in order to improve the following deficits and impairments:  Decreased activity tolerance, Decreased range of motion, Decreased strength, Increased edema, Impaired UE functional use, Pain  Visit Diagnosis: Acute pain of left shoulder  Muscle weakness (generalized)  Abnormal posture     Problem List Patient Active Problem List   Diagnosis Date Noted  . Shoulder injury, left, initial encounter 08/03/2016  . Onychocryptosis 03/30/2016  . Atherosclerosis of native artery of both lower extremities with intermittent claudication (HCC) 03/23/2016  . Cigarette smoker 01/13/2016  . Obesity (BMI 30-39.9) 11/05/2015  . Substance abuse in remission 11/05/2015  .  Chronic obstructive pulmonary disease (HCC) 04/27/2015  . Diabetes mellitus (HCC) 08/02/2013  . Essential hypertension 08/02/2013  . Hyperlipidemia 08/02/2013     Kipp Laurence, PT, DPT 08/23/16 3:19 PM   [facility] [address]  [address] [place], [place], [address] Phone: [phone]   Fax:  [phone]  Name: Janyia Guion MRN: [id number] Date of Birth: 02-27-1961

## 2016-08-25 ENCOUNTER — Ambulatory Visit: Payer: No Typology Code available for payment source | Admitting: Physical Therapy

## 2016-08-25 DIAGNOSIS — M6281 Muscle weakness (generalized): Secondary | ICD-10-CM

## 2016-08-25 DIAGNOSIS — M25512 Pain in left shoulder: Secondary | ICD-10-CM | POA: Diagnosis not present

## 2016-08-25 DIAGNOSIS — R293 Abnormal posture: Secondary | ICD-10-CM

## 2016-08-25 NOTE — Therapy (Signed)
[facility] [address]  [address] [place], [place], [address] Phone: [phone]   Fax:  [phone]  Physical Therapy Treatment  Patient Details  Name: Kathryn Dillon MRN: [id number] Date of Birth: August 18, 1960 Referring Provider: Dr Norton Blizzard  Encounter Date: 08/25/2016      PT End of Session - 08/25/16 1017    Visit Number 4   Number of Visits 16   Date for PT Re-Evaluation 10/04/16   PT Start Time 1016   PT Stop Time 1114   PT Time Calculation (min) 58 min   Activity Tolerance Patient tolerated treatment well   Behavior During Therapy [facility] for tasks assessed/performed      Past Medical History:  Diagnosis Date  . COPD (chronic obstructive pulmonary disease) with chronic bronchitis (HCC)   . Diabetes mellitus without complication (HCC)   . H/O drug abuse   . H/O ETOH abuse   . High cholesterol   . Hypertension   . PAD (peripheral artery disease) (HCC)     No past surgical history on file.  There were no vitals filed for this visit.      Subjective Assessment - 08/25/16 1017    Subjective feeling really well - did not have to take pain medication - will be taking self out of sling. Good HEP compliance.    Diagnostic tests Xray - closed nondisplaced fracture of glenoid cavity of L scapula   Patient Stated Goals improve function of L shoulder   Currently in Pain? No/denies   Pain Score 0-No pain                         [facility] Adult PT Treatment/Exercise - 08/25/16 1018      Shoulder Exercises: Supine   Flexion Strengthening;Left;15 reps;Weights   Shoulder Flexion Weight (lbs) 3   Flexion Limitations "soreness" - pt reporting 2# felt "easy"     Shoulder Exercises: Seated   Other Seated Exercises bicep curl with green tband x 15 reps     Shoulder Exercises: Sidelying   External Rotation Strengthening;Left;15 reps;Weights   External Rotation Weight (lbs) 2   External Rotation  Limitations no pain   ABduction Strengthening;Left;15 reps;Weights   ABduction Weight (lbs) 3   ABduction Limitations no pain     Shoulder Exercises: Standing   External Rotation Strengthening;Left;15 reps;Theraband   Theraband Level (Shoulder External Rotation) Level 2 (Red)   Internal Rotation Strengthening;Left;15 reps;Theraband   Theraband Level (Shoulder Internal Rotation) Level 2 (Red)   Extension Strengthening;Both;15 reps;Theraband   Theraband Level (Shoulder Extension) Level 2 (Red)   Extension Limitations with scap squeeze - 5 sec hold   Row Strengthening;Both;15 reps;Theraband   Theraband Level (Shoulder Row) Level 2 (Red)   Row Limitations with scap squeeze - 5 sec hold     Shoulder Exercises: ROM/Strengthening   UBE (Upper Arm Bike) level 2.5 x 6 minutes (3/3)     Shoulder Exercises: Stretch   Corner Stretch 3 reps;30 seconds   Corner Stretch Limitations low/mid/high in doorway   Other Shoulder Stretches IR towel stretch 3 x 30 seconds     Modalities   Modalities Electrical Stimulation;Moist Heat     Moist Heat Therapy   Number Minutes Moist Heat 15 Minutes   Moist Heat Location Shoulder     Electrical Stimulation   Electrical Stimulation Location L shoulder   Electrical Stimulation Action IFC   Electrical Stimulation Parameters to tolerance  Electrical Stimulation Goals Pain                  PT Short Term Goals - 08/17/16 1015      PT SHORT TERM GOAL #1   Title patient to be independent with HEP (09/06/16)   Status On-going     PT SHORT TERM GOAL #2   Title patient to improve L shoulder AROM equal to that of R shoulder with no increase in pain (09/06/16)   Status On-going           PT Long Term Goals - 08/17/16 0929      PT LONG TERM GOAL #1   Title patient to be independent with advanced HEP (10/04/16)   Status On-going     PT LONG TERM GOAL #2   Title patient to demonstrate ability to maintain good postural alignment with ability to  self-correct (10/04/16)   Status On-going     PT LONG TERM GOAL #3   Title patient to improve L UE strength to >/= 4+/5 with no increase in pain (10/04/16)   Status On-going     PT LONG TERM GOAL #4   Title patient to report ability ot perform household chores and ADLs with no increase in L shoulder pain (10/04/16)   Status On-going               Plan - 08/25/16 1018    Clinical Impression Statement Kathryn Dillon doing very well today - no pain today and feeling like she can do a lot more with shoulder. Patient stating she will be taking herself out of sling as she feels it is no longer necessary. Patient today tolerable to increased resistance with ROM activities as well as resisted strengthening for periscapular musculature. Patient to continue to benefit from PT to maximize functional use of L UE.    PT Treatment/Interventions ADLs/Self Care Home Management;Cryotherapy;Electrical Stimulation;Iontophoresis 4mg /ml Dexamethasone;Moist Heat;Ultrasound;Therapeutic exercise;Therapeutic activities;Patient/family education;Manual techniques;Passive range of motion;Vasopneumatic Device;Taping;Dry needling   PT Next Visit Plan progress L shoulder AAROM/AROM as tolerated; manual work and modalities as indicated.    Consulted and Agree with Plan of Care Patient      Patient will benefit from skilled therapeutic intervention in order to improve the following deficits and impairments:  Decreased activity tolerance, Decreased range of motion, Decreased strength, Increased edema, Impaired UE functional use, Pain  Visit Diagnosis: Acute pain of left shoulder  Muscle weakness (generalized)  Abnormal posture     Problem List Patient Active Problem List   Diagnosis Date Noted  . Shoulder injury, left, initial encounter 08/03/2016  . Onychocryptosis 03/30/2016  . Atherosclerosis of native artery of both lower extremities with intermittent claudication (HCC) 03/23/2016  . Cigarette smoker 01/13/2016  .  Obesity (BMI 30-39.9) 11/05/2015  . Substance abuse in remission 11/05/2015  . Chronic obstructive pulmonary disease (HCC) 04/27/2015  . Diabetes mellitus (HCC) 08/02/2013  . Essential hypertension 08/02/2013  . Hyperlipidemia 08/02/2013     Kipp LaurenceStephanie R Marjo Grosvenor, PT, DPT 08/25/16 2:20 PM   [facility] [address]  [address] [place], [place], [address] Phone: [phone]   Fax:  [phone]  Name: Kathryn Dillon MRN: [id number] Date of Birth: Nov 17, 1960

## 2016-08-30 ENCOUNTER — Ambulatory Visit: Payer: No Typology Code available for payment source | Admitting: Physical Therapy

## 2016-08-30 ENCOUNTER — Ambulatory Visit (INDEPENDENT_AMBULATORY_CARE_PROVIDER_SITE_OTHER): Payer: Medicaid Other | Admitting: Family Medicine

## 2016-08-30 ENCOUNTER — Encounter: Payer: Self-pay | Admitting: Family Medicine

## 2016-08-30 DIAGNOSIS — M6281 Muscle weakness (generalized): Secondary | ICD-10-CM

## 2016-08-30 DIAGNOSIS — R293 Abnormal posture: Secondary | ICD-10-CM

## 2016-08-30 DIAGNOSIS — M25512 Pain in left shoulder: Secondary | ICD-10-CM | POA: Diagnosis not present

## 2016-08-30 DIAGNOSIS — S4992XD Unspecified injury of left shoulder and upper arm, subsequent encounter: Secondary | ICD-10-CM | POA: Diagnosis not present

## 2016-08-30 NOTE — Therapy (Signed)
[facility] [place] [address]  [address] [place], [place], [address] Phone: [phone]   Fax:  [phone]  Physical Therapy Treatment  Patient Details  Name: Kathryn Dillon MRN: [id number] Date of Birth: Jun 23, 1961 Referring Provider: Dr Norton BlizzardShane Hudnall  Encounter Date: 08/30/2016      PT End of Session - 08/30/16 1535    Visit Number 5   Number of Visits 16   Date for PT Re-Evaluation 10/04/16   PT Start Time 1532   PT Stop Time 1629   PT Time Calculation (min) 57 min   Activity Tolerance Patient tolerated treatment well   Behavior During Therapy [facility] for tasks assessed/performed      Past Medical History:  Diagnosis Date  . COPD (chronic obstructive pulmonary disease) with chronic bronchitis (HCC)   . Diabetes mellitus without complication (HCC)   . H/O drug abuse   . H/O ETOH abuse   . High cholesterol   . Hypertension   . PAD (peripheral artery disease) (HCC)     No past surgical history on file.  There were no vitals filed for this visit.      Subjective Assessment - 08/30/16 1534    Subjective Saw MD this morning - he believes she is doing really well - has slight soreness due to position MD put her in - wants to do 2 more weeks of PT.    Diagnostic tests Xray - closed nondisplaced fracture of glenoid cavity of L scapula   Patient Stated Goals improve function of L shoulder   Currently in Pain? No/denies   Pain Score 0-No pain                         [facility] Adult PT Treatment/Exercise - 08/30/16 1537      Shoulder Exercises: Prone   Retraction Strengthening;Left;15 reps;Weights   Retraction Weight (lbs) 3   Retraction Limitations prone row with scap squeeze   Extension Strengthening;Left;15 reps;Weights   Extension Weight (lbs) 2   Extension Limitations prone I's with scap squeeze   Horizontal ABduction 1 Strengthening;Left;15 reps;Weights   Horizontal ABduction 1 Weight (lbs) 2    Horizontal ABduction 1 Limitations prone T's with scap squeeze     Shoulder Exercises: Sidelying   External Rotation Strengthening;Left;15 reps;Weights   External Rotation Weight (lbs) 3   ABduction Strengthening;Left;15 reps;Weights   ABduction Weight (lbs) 3     Shoulder Exercises: Standing   External Rotation Strengthening;Left;15 reps;Theraband   Theraband Level (Shoulder External Rotation) Level 3 (Green)   Internal Rotation Strengthening;Left;15 reps;Theraband   Theraband Level (Shoulder Internal Rotation) Level 3 (Green)   Extension Strengthening;Both;15 reps;Theraband   Theraband Level (Shoulder Extension) Level 3 (Green)   Extension Limitations with scap squeeze - 5 sec hold   Row Strengthening;Both;15 reps;Theraband   Theraband Level (Shoulder Row) Level 3 (Green)   Row Limitations with scap squeeze - 5 sec hold   Other Standing Exercises bicep curl x 15 - 5# L UE     Shoulder Exercises: ROM/Strengthening   UBE (Upper Arm Bike) Level 4 x 6 minutes (3/3)     Shoulder Exercises: Stretch   Corner Stretch 3 reps;30 seconds   Corner Stretch Limitations low/mid/high in Sport and exercise psychologistdoorway     Modalities   Modalities Electrical Stimulation;Moist Heat     Moist Heat Therapy   Number Minutes Moist Heat 15 Minutes   Moist Heat Location Shoulder     Electrical Stimulation  Electrical Stimulation Location L shoulder   Electrical Stimulation Action IFC   Electrical Stimulation Parameters to tolerance   Electrical Stimulation Goals Pain                  PT Short Term Goals - 08/30/16 1535      PT SHORT TERM GOAL #1   Title patient to be independent with HEP (09/06/16)   Status Achieved     PT SHORT TERM GOAL #2   Title patient to improve L shoulder AROM equal to that of R shoulder with no increase in pain (09/06/16)   Status On-going           PT Long Term Goals - 08/17/16 0929      PT LONG TERM GOAL #1   Title patient to be independent with advanced HEP (10/04/16)    Status On-going     PT LONG TERM GOAL #2   Title patient to demonstrate ability to maintain good postural alignment with ability to self-correct (10/04/16)   Status On-going     PT LONG TERM GOAL #3   Title patient to improve L UE strength to >/= 4+/5 with no increase in pain (10/04/16)   Status On-going     PT LONG TERM GOAL #4   Title patient to report ability ot perform household chores and ADLs with no increase in L shoulder pain (10/04/16)   Status On-going               Plan - 08/30/16 1536    Clinical Impression Statement Kathryn Dillon doing very well - saw MD and would like to continue PT for 2 more weeks. Patient today with no pain with ability to progress all shoulder strengthening tasks today with no pain production, only appropriate muscle fatigue. patient to continue to benefit form PT to maximize functional use of L UE.    PT Treatment/Interventions ADLs/Self Care Home Management;Cryotherapy;Electrical Stimulation;Iontophoresis 4mg /ml Dexamethasone;Moist Heat;Ultrasound;Therapeutic exercise;Therapeutic activities;Patient/family education;Manual techniques;Passive range of motion;Vasopneumatic Device;Taping;Dry needling   PT Next Visit Plan progress L shoulder AAROM/AROM as tolerated; manual work and modalities as indicated.    Consulted and Agree with Plan of Care Patient      Patient will benefit from skilled therapeutic intervention in order to improve the following deficits and impairments:  Decreased activity tolerance, Decreased range of motion, Decreased strength, Increased edema, Impaired UE functional use, Pain  Visit Diagnosis: Acute pain of left shoulder  Muscle weakness (generalized)  Abnormal posture     Problem List Patient Active Problem List   Diagnosis Date Noted  . Shoulder injury, left, initial encounter 08/03/2016  . Onychocryptosis 03/30/2016  . Atherosclerosis of native artery of both lower extremities with intermittent claudication (HCC)  03/23/2016  . Cigarette smoker 01/13/2016  . Obesity (BMI 30-39.9) 11/05/2015  . Substance abuse in remission 11/05/2015  . Chronic obstructive pulmonary disease (HCC) 04/27/2015  . Diabetes mellitus (HCC) 08/02/2013  . Essential hypertension 08/02/2013  . Hyperlipidemia 08/02/2013     Kipp Laurence, PT, DPT 08/30/16 6:25 PM   [facility] [address]  [address] [place], [place], [address] Phone: [phone]   Fax:  [phone]  Name: Kathryn Dillon MRN: [id number] Date of Birth: 08-Jan-1961

## 2016-08-30 NOTE — Patient Instructions (Signed)
Continue with the physical therapy for 2 more weeks then transition to the home exercise program if you're doing well. Follow up with me in 6 weeks.
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
## 2016-08-30 NOTE — Assessment & Plan Note (Signed)
2/2 nondisplaced glenoid fracture.  Doing extremely well clinically.  She will continue with PT for 2 more weeks then transition to home exercise program.  Tylenol, voltaren if needed.  F/u in 6 weeks.

## 2016-08-31 ENCOUNTER — Encounter: Payer: Self-pay | Admitting: Podiatry

## 2016-08-31 ENCOUNTER — Ambulatory Visit (INDEPENDENT_AMBULATORY_CARE_PROVIDER_SITE_OTHER): Payer: Medicaid Other | Admitting: Podiatry

## 2016-08-31 DIAGNOSIS — L600 Ingrowing nail: Secondary | ICD-10-CM | POA: Diagnosis not present

## 2016-08-31 DIAGNOSIS — M79672 Pain in left foot: Secondary | ICD-10-CM

## 2016-08-31 DIAGNOSIS — M79671 Pain in right foot: Secondary | ICD-10-CM

## 2016-08-31 DIAGNOSIS — B351 Tinea unguium: Secondary | ICD-10-CM

## 2016-08-31 DIAGNOSIS — I739 Peripheral vascular disease, unspecified: Secondary | ICD-10-CM

## 2016-08-31 NOTE — Patient Instructions (Signed)
Seen for hypertrophic nails. All nails debrided. Return in 3 months or as needed.  

## 2016-08-31 NOTE — Progress Notes (Signed)
SUBJECTIVE: 56 y.o. year old female presents for diabetic foot care. Patient request toe nails trimmed. Her mother was sick and lost track of time since she missed last appointment. Diagnosed with diabetic in 2006. Blood sugar was 101 this morning. Last A1C was 7.4 last month. Patient is ambulatory without assistance.  She walks 30 minutes daily but gets pain in calf after a few blocks.   HPI: Has blockage in both leg since early 2017. In process of making arrangement for possible reconstructive procedure. Also numbness, tingling and stinging on toes and bottom of feet.  OBJECTIVE: DERMATOLOGIC EXAMINATION: Thick hypertrophic nails x 10. Symptomatic ingrown nails on both great toes.  VASCULAR EXAMINATION OF LOWER LIMBS: Pedal pulses are not palpable bilateral.  Temperature gradient from tibial crest to dorsum of foot is within normal bilateral. No sign of ischemic skin change noted.  NEUROLOGIC EXAMINATION OF THE LOWER LIMBS: Achilles DTR is present and within normal. Monofilament (Semmes-Weinstein 10-gm) sensory testing show diminished response. Failed to respond in lesser digits, 1 out of 5 on each foot. 2. Vibratory sensations(128Hz  turning fork) intact at medial and lateral forefoot bilateral.  Sharp and Dull discriminatory sensations at the plantar ball of hallux is intact bilateral.   MUSCULOSKELETAL EXAMINATION: No gross deformities noted. Pain in calf after walking a few blocks away bilateral.  ASSESSMENT: Painful ingrown nails on both great toes. Mycotic nails x 10. PVD lower limbs with claudication. Uncontrolled IDDM.  PLAN: Reviewed clinical findings and available options. All nails debrided. Pain was relieved. Continue with daily walking exercise to promote blood flow in lower limbs. Return in 3 months or as needed

## 2016-09-01 ENCOUNTER — Ambulatory Visit: Payer: No Typology Code available for payment source | Attending: Family Medicine | Admitting: Physical Therapy

## 2016-09-01 DIAGNOSIS — M25512 Pain in left shoulder: Secondary | ICD-10-CM | POA: Diagnosis not present

## 2016-09-01 DIAGNOSIS — M6281 Muscle weakness (generalized): Secondary | ICD-10-CM | POA: Diagnosis present

## 2016-09-01 DIAGNOSIS — R293 Abnormal posture: Secondary | ICD-10-CM | POA: Insufficient documentation

## 2016-09-01 NOTE — Therapy (Signed)
[facility] [place] [address]  [address] [place], [place], [address] Phone: [phone]   Fax:  [phone]  Physical Therapy Treatment  Patient Details  Name: Kathryn PolkaRhonda Dillon MRN: [id number] Date of Birth: 09/15/1960 Referring Provider: Dr Norton BlizzardShane Hudnall  Encounter Date: 09/01/2016      PT End of Session - 09/01/16 0802    Visit Number 6   Number of Visits 16   Date for PT Re-Evaluation 10/04/16   PT Start Time 0754   PT Stop Time 0833   PT Time Calculation (min) 39 min   Activity Tolerance Patient tolerated treatment well   Behavior During Therapy [facility] for tasks assessed/performed      Past Medical History:  Diagnosis Date  . COPD (chronic obstructive pulmonary disease) with chronic bronchitis (HCC)   . Diabetes mellitus without complication (HCC)   . H/O drug abuse   . H/O ETOH abuse   . High cholesterol   . Hypertension   . PAD (peripheral artery disease) (HCC)     No past surgical history on file.  There were no vitals filed for this visit.      Subjective Assessment - 09/01/16 0755    Subjective Feeling well - some soreness after last visit   Diagnostic tests Xray - closed nondisplaced fracture of glenoid cavity of L scapula   Patient Stated Goals improve function of L shoulder   Currently in Pain? No/denies   Pain Score 0-No pain  just some "soreness"                         [facility] Adult PT Treatment/Exercise - 09/01/16 0756      Shoulder Exercises: Seated   Horizontal ABduction Strengthening;Both;15 reps;Theraband   Theraband Level (Shoulder Horizontal ABduction) Level 3 (Green)   Horizontal ABduction Limitations with scap squeeze   External Rotation Strengthening;Both;15 reps;Theraband   Theraband Level (Shoulder External Rotation) Level 3 (Green)   External Rotation Limitations with scap squeeze     Shoulder Exercises: Prone   Retraction Strengthening;Left;15 reps;Weights   Retraction Weight (lbs) 4   Retraction Limitations prone row with scap squeeze   Extension Strengthening;Left;15 reps;Weights   Extension Weight (lbs) 3   Extension Limitations prone I's with scap squeeze   Horizontal ABduction 1 Strengthening;Left;15 reps;Weights   Horizontal ABduction 1 Weight (lbs) 3   Horizontal ABduction 1 Limitations prone T's with scap squeeze   Horizontal ABduction 2 Strengthening;Left;15 reps;Weights   Horizontal ABduction 2 Weight (lbs) 1   Horizontal ABduction 2 Limitations prone Y's with scap squeeze     Shoulder Exercises: Standing   External Rotation Strengthening;Left;15 reps;Theraband   Theraband Level (Shoulder External Rotation) Level 3 (Green)   External Rotation Limitations 2 sets   Internal Rotation Strengthening;Left;15 reps;Theraband   Theraband Level (Shoulder Internal Rotation) Level 3 (Green)   Internal Rotation Limitations 2 sets   Other Standing Exercises PNF D2 extension x 15 - red tband; PNF D1 Flexion x 15 - red tband   Other Standing Exercises overhead lifting to cabinet - 2# x 10; 3# x 10     Shoulder Exercises: ROM/Strengthening   UBE (Upper Arm Bike) level 4.5 x 6 minutes (3/3)   Cybex Row Limitations 25# 2 x 15 with 5" hold   Rhythmic Stabilization, Supine yellow medball - CW x 20; CCW x 20; A-Z                  PT  Short Term Goals - 08/30/16 1535      PT SHORT TERM GOAL #1   Title patient to be independent with HEP (09/06/16)   Status Achieved     PT SHORT TERM GOAL #2   Title patient to improve L shoulder AROM equal to that of R shoulder with no increase in pain (09/06/16)   Status On-going           PT Long Term Goals - 08/17/16 0929      PT LONG TERM GOAL #1   Title patient to be independent with advanced HEP (10/04/16)   Status On-going     PT LONG TERM GOAL #2   Title patient to demonstrate ability to maintain good postural alignment with ability to self-correct (10/04/16)   Status On-going     PT LONG  TERM GOAL #3   Title patient to improve L UE strength to >/= 4+/5 with no increase in pain (10/04/16)   Status On-going     PT LONG TERM GOAL #4   Title patient to report ability ot perform household chores and ADLs with no increase in L shoulder pain (10/04/16)   Status On-going               Plan - 09/01/16 0802    Clinical Impression Statement Patient continues to report no pain - only some muscle soreness following treatment. Tolerable to all increase in resistance and shoulder strengthening with no issue. VC required through session to ensure proper upright posture as patient prefers a rounded shoulder stance. Paitnet to benefit form continued PT to maximize functional mobility and use of L UE   PT Treatment/Interventions ADLs/Self Care Home Management;Cryotherapy;Electrical Stimulation;Iontophoresis 4mg /ml Dexamethasone;Moist Heat;Ultrasound;Therapeutic exercise;Therapeutic activities;Patient/family education;Manual techniques;Passive range of motion;Vasopneumatic Device;Taping;Dry needling   PT Next Visit Plan L shoulder and periscapular strengthening - madalities prn for pain   Consulted and Agree with Plan of Care Patient      Patient will benefit from skilled therapeutic intervention in order to improve the following deficits and impairments:  Decreased activity tolerance, Decreased range of motion, Decreased strength, Increased edema, Impaired UE functional use, Pain  Visit Diagnosis: Acute pain of left shoulder  Muscle weakness (generalized)  Abnormal posture     Problem List Patient Active Problem List   Diagnosis Date Noted  . Shoulder injury, left, subsequent encounter 08/03/2016  . Onychocryptosis 03/30/2016  . Atherosclerosis of native artery of both lower extremities with intermittent claudication (HCC) 03/23/2016  . Cigarette smoker 01/13/2016  . Obesity (BMI 30-39.9) 11/05/2015  . Substance abuse in remission 11/05/2015  . Chronic obstructive pulmonary  disease (HCC) 04/27/2015  . Diabetes mellitus (HCC) 08/02/2013  . Essential hypertension 08/02/2013  . Hyperlipidemia 08/02/2013    Kipp Laurence, PT, DPT 09/01/16 8:33 AM   [facility] [address]  [address] [place], [place], [address] Phone: [phone]   Fax:  [phone]  Name: Kathryn Dillon MRN: [id number] Date of Birth: 1961/03/17

## 2016-09-06 ENCOUNTER — Ambulatory Visit: Payer: No Typology Code available for payment source | Admitting: Physical Therapy

## 2016-09-08 ENCOUNTER — Ambulatory Visit: Payer: No Typology Code available for payment source | Admitting: Physical Therapy

## 2016-09-08 DIAGNOSIS — M6281 Muscle weakness (generalized): Secondary | ICD-10-CM

## 2016-09-08 DIAGNOSIS — M25512 Pain in left shoulder: Secondary | ICD-10-CM

## 2016-09-08 DIAGNOSIS — R293 Abnormal posture: Secondary | ICD-10-CM

## 2016-09-08 NOTE — Therapy (Signed)
[facility] [address]  [address] [place], [place], [address] Phone: [phone]   Fax:  [phone]  Physical Therapy Treatment  Patient Details  Name: Kathryn Dillon MRN: [id number] Date of Birth: 05/27/1961 Referring Provider: Dr Norton Blizzard  Encounter Date: 09/08/2016      PT End of Session - 09/08/16 0800    Visit Number 7   Number of Visits 16   Date for PT Re-Evaluation 10/04/16   PT Start Time 0755   PT Stop Time 0834   PT Time Calculation (min) 39 min   Activity Tolerance Patient tolerated treatment well   Behavior During Therapy [facility] for tasks assessed/performed      Past Medical History:  Diagnosis Date  . COPD (chronic obstructive pulmonary disease) with chronic bronchitis (HCC)   . Diabetes mellitus without complication (HCC)   . H/O drug abuse   . H/O ETOH abuse   . High cholesterol   . Hypertension   . PAD (peripheral artery disease) (HCC)     No past surgical history on file.  There were no vitals filed for this visit.      Subjective Assessment - 09/08/16 0800    Subjective Some soreness - feels like its the weather or arthritis   Diagnostic tests Xray - closed nondisplaced fracture of glenoid cavity of L scapula   Patient Stated Goals improve function of L shoulder   Currently in Pain? No/denies   Pain Score 0-No pain                         [facility] Adult PT Treatment/Exercise - 09/08/16 0801      Shoulder Exercises: Seated   Horizontal ABduction Strengthening;Both;15 reps;Theraband   Theraband Level (Shoulder Horizontal ABduction) Level 3 (Green)   Horizontal ABduction Limitations with scap squeeze   External Rotation Strengthening;Both;15 reps;Theraband   Theraband Level (Shoulder External Rotation) Level 3 (Green)   External Rotation Limitations with scap squeeze   Other Seated Exercises bicep curl with green tband x 15 reps     Shoulder Exercises: Prone   Retraction Strengthening;Left;15 reps;Weights   Retraction Weight (lbs) 4   Retraction Limitations prone row with scap squeeze   Extension Strengthening;Left;15 reps;Weights   Extension Weight (lbs) 4   Extension Limitations prone I's with scap squeeze   Horizontal ABduction 1 Strengthening;Left;15 reps;Weights   Horizontal ABduction 1 Weight (lbs) 3   Horizontal ABduction 1 Limitations prone T's with scap squeeze   Horizontal ABduction 2 Strengthening;Left;15 reps;Weights   Horizontal ABduction 2 Weight (lbs) 2   Horizontal ABduction 2 Limitations prone Y's with scap squeeze     Shoulder Exercises: Sidelying   External Rotation Strengthening;Left;15 reps;Weights   External Rotation Weight (lbs) 4     Shoulder Exercises: Standing   External Rotation Strengthening;Left;15 reps;Theraband   Theraband Level (Shoulder External Rotation) Level 3 (Green)   External Rotation Limitations 2 sets   Internal Rotation Strengthening;Left;15 reps;Theraband   Theraband Level (Shoulder Internal Rotation) Level 3 (Green)   Internal Rotation Limitations 2 sets   Other Standing Exercises overhead lifting to cabinet - flexion 3# x 15; abduction 3# x 15     Shoulder Exercises: ROM/Strengthening   UBE (Upper Arm Bike) level 6 x 6 minutes (3/3)   Cybex Row Limitations 35# 2 x 15 - 5" hold   Rhythmic Stabilization, Supine yellow medball - CW x 20; CCW x 20; A-Z  PT Short Term Goals - 08/30/16 1535      PT SHORT TERM GOAL #1   Title patient to be independent with HEP (09/06/16)   Status Achieved     PT SHORT TERM GOAL #2   Title patient to improve L shoulder AROM equal to that of R shoulder with no increase in pain (09/06/16)   Status On-going           PT Long Term Goals - 08/17/16 0929      PT LONG TERM GOAL #1   Title patient to be independent with advanced HEP (10/04/16)   Status On-going     PT LONG TERM GOAL #2   Title patient to demonstrate ability to maintain  good postural alignment with ability to self-correct (10/04/16)   Status On-going     PT LONG TERM GOAL #3   Title patient to improve L UE strength to >/= 4+/5 with no increase in pain (10/04/16)   Status On-going     PT LONG TERM GOAL #4   Title patient to report ability ot perform household chores and ADLs with no increase in L shoulder pain (10/04/16)   Status On-going               Plan - 09/08/16 0800    Clinical Impression Statement Good progression of strength today - no pain, only appropriate muscle soreness. Making good progress towards meeting all goals. Will plan to establish comprehensive HEP at subsequent visits to transition patient to independent HEP.    PT Treatment/Interventions ADLs/Self Care Home Management;Cryotherapy;Electrical Stimulation;Iontophoresis 4mg /ml Dexamethasone;Moist Heat;Ultrasound;Therapeutic exercise;Therapeutic activities;Patient/family education;Manual techniques;Passive range of motion;Vasopneumatic Device;Taping;Dry needling   PT Next Visit Plan L shoulder and periscapular strengthening - madalities prn for pain   Consulted and Agree with Plan of Care Patient      Patient will benefit from skilled therapeutic intervention in order to improve the following deficits and impairments:  Decreased activity tolerance, Decreased range of motion, Decreased strength, Increased edema, Impaired UE functional use, Pain  Visit Diagnosis: Acute pain of left shoulder  Muscle weakness (generalized)  Abnormal posture     Problem List Patient Active Problem List   Diagnosis Date Noted  . Shoulder injury, left, subsequent encounter 08/03/2016  . Onychocryptosis 03/30/2016  . Atherosclerosis of native artery of both lower extremities with intermittent claudication (HCC) 03/23/2016  . Cigarette smoker 01/13/2016  . Obesity (BMI 30-39.9) 11/05/2015  . Substance abuse in remission 11/05/2015  . Chronic obstructive pulmonary disease (HCC) 04/27/2015  .  Diabetes mellitus (HCC) 08/02/2013  . Essential hypertension 08/02/2013  . Hyperlipidemia 08/02/2013     Kipp LaurenceStephanie R Addelynn Batte, PT, DPT 09/08/16 8:35 AM   [facility] [place] [address]  [address] [place], [place], [address] Phone: [phone]   Fax:  [phone]  Name: Kathryn Dillon MRN: [id number] Date of Birth: 1960/11/28

## 2016-09-13 ENCOUNTER — Ambulatory Visit: Payer: No Typology Code available for payment source | Admitting: Physical Therapy

## 2016-09-15 ENCOUNTER — Ambulatory Visit: Payer: No Typology Code available for payment source | Admitting: Physical Therapy

## 2016-09-15 DIAGNOSIS — M25512 Pain in left shoulder: Secondary | ICD-10-CM

## 2016-09-15 DIAGNOSIS — M6281 Muscle weakness (generalized): Secondary | ICD-10-CM

## 2016-09-15 DIAGNOSIS — R293 Abnormal posture: Secondary | ICD-10-CM

## 2016-09-15 NOTE — Therapy (Signed)
[facility] [place] [address]  [address] [place], [place], [address] Phone: [phone]   Fax:  [phone]  Physical Therapy Treatment  Patient Details  Name: Kathryn PolkaRhonda Dillon MRN: [id number] Date of Birth: 02/01/1961 Referring Provider: Dr Norton BlizzardShane Hudnall  Encounter Date: 09/15/2016      PT End of Session - 09/15/16 0805    Visit Number 8   Number of Visits 16   Date for PT Re-Evaluation 10/04/16   PT Start Time 0801   PT Stop Time 0842   PT Time Calculation (min) 41 min   Activity Tolerance Patient tolerated treatment well   Behavior [facility] for tasks assessed/performed      Past Medical History:  Diagnosis Date  . COPD (chronic obstructive pulmonary disease) with chronic bronchitis (HCC)   . Diabetes mellitus without complication (HCC)   . H/O drug abuse   . H/O ETOH abuse   . High cholesterol   . Hypertension   . PAD (peripheral artery disease) (HCC)     No past surgical history on file.  There were no vitals filed for this visit.      Subjective Assessment - 09/15/16 0802    Subjective "My arm feels good" - had no issues during vacation   Diagnostic tests Xray - closed nondisplaced fracture of glenoid cavity of L scapula   Patient Stated Goals improve function of L shoulder   Currently in Pain? No/denies   Pain Score 0-No pain                         [facility] Adult PT Treatment/Exercise - 09/15/16 0806      Shoulder Exercises: Prone   Retraction Strengthening;Left;15 reps;Weights   Retraction Weight (lbs) 5   Retraction Limitations prone row with scap squeeze   Extension Strengthening;Left;15 reps;Weights   Extension Weight (lbs) 5   Extension Limitations prone I's with scap squeeze   Horizontal ABduction 1 Strengthening;Left;15 reps;Weights   Horizontal ABduction 1 Weight (lbs) 3   Horizontal ABduction 1 Limitations prone T's with scap squeeze   Horizontal ABduction 2  Strengthening;Left;15 reps;Weights   Horizontal ABduction 2 Weight (lbs) 2   Horizontal ABduction 2 Limitations prone Y's with scap squeeze     Shoulder Exercises: Sidelying   External Rotation Strengthening;Left;15 reps;Weights   External Rotation Weight (lbs) 4   ABduction Strengthening;Left;15 reps;Weights   ABduction Weight (lbs) 4     Shoulder Exercises: Standing   External Rotation Strengthening;Left;15 reps;Theraband   Theraband Level (Shoulder External Rotation) Level 3 (Green)   External Rotation Limitations overhead   Internal Rotation Strengthening;Left;15 reps;Theraband   Theraband Level (Shoulder Internal Rotation) Level 3 (Green)   Internal Rotation Limitations overhead   Other Standing Exercises bicep curl 2 x 15 - black tband   Other Standing Exercises overhead lifting to cabinet - flexion 4# x 15; abduction 4# x 15     Shoulder Exercises: ROM/Strengthening   UBE (Upper Arm Bike) Level 7 x 6 minutes (3/3)   Cybex Row Limitations 45# 2 x 15 - 5 sec hold   Other ROM/Strengthening Exercises BATCA pull-down - 25# x 15 reps - 5 sec hold                  PT Short Term Goals - 08/30/16 1535      PT SHORT TERM GOAL #1   Title patient to be independent with HEP (09/06/16)   Status Achieved  PT SHORT TERM GOAL #2   Title patient to improve L shoulder AROM equal to that of R shoulder with no increase in pain (09/06/16)   Status On-going           PT Long Term Goals - 08/17/16 0929      PT LONG TERM GOAL #1   Title patient to be independent with advanced HEP (10/04/16)   Status On-going     PT LONG TERM GOAL #2   Title patient to demonstrate ability to maintain good postural alignment with ability to self-correct (10/04/16)   Status On-going     PT LONG TERM GOAL #3   Title patient to improve L UE strength to >/= 4+/5 with no increase in pain (10/04/16)   Status On-going     PT LONG TERM GOAL #4   Title patient to report ability ot perform household  chores and ADLs with no increase in L shoulder pain (10/04/16)   Status On-going               Plan - 09/15/16 0805    Clinical Impression Statement Patient doing well today - tolerating progression of overhead strengthening with no pain. SOme VC and TC required to ensure proper form of movement. Will continue to progress functional and overhead strengthening.    PT Treatment/Interventions ADLs/Self Care Home Management;Cryotherapy;Electrical Stimulation;Iontophoresis 4mg /ml Dexamethasone;Moist Heat;Ultrasound;Therapeutic exercise;Therapeutic activities;Patient/family education;Manual techniques;Passive range of motion;Vasopneumatic Device;Taping;Dry needling   PT Next Visit Plan L shoulder and periscapular strengthening - madalities prn for pain   Consulted and Agree with Plan of Care Patient      Patient will benefit from skilled therapeutic intervention in order to improve the following deficits and impairments:  Decreased activity tolerance, Decreased range of motion, Decreased strength, Increased edema, Impaired UE functional use, Pain  Visit Diagnosis: Acute pain of left shoulder  Muscle weakness (generalized)  Abnormal posture     Problem List Patient Active Problem List   Diagnosis Date Noted  . Shoulder injury, left, subsequent encounter 08/03/2016  . Onychocryptosis 03/30/2016  . Atherosclerosis of native artery of both lower extremities with intermittent claudication (HCC) 03/23/2016  . Cigarette smoker 01/13/2016  . Obesity (BMI 30-39.9) 11/05/2015  . Substance abuse in remission 11/05/2015  . Chronic obstructive pulmonary disease (HCC) 04/27/2015  . Diabetes mellitus (HCC) 08/02/2013  . Essential hypertension 08/02/2013  . Hyperlipidemia 08/02/2013     Kipp Laurence, PT, DPT 09/15/16 8:45 AM   [facility] [address]  [address] [place], [place], [address] Phone: [phone]   Fax:   [phone]  Name: Kathryn Dillon MRN: [id number] Date of Birth: 1961/02/08

## 2016-09-20 ENCOUNTER — Ambulatory Visit: Payer: No Typology Code available for payment source | Admitting: Physical Therapy

## 2016-09-20 DIAGNOSIS — M6281 Muscle weakness (generalized): Secondary | ICD-10-CM

## 2016-09-20 DIAGNOSIS — M25512 Pain in left shoulder: Secondary | ICD-10-CM | POA: Diagnosis not present

## 2016-09-20 DIAGNOSIS — R293 Abnormal posture: Secondary | ICD-10-CM

## 2016-09-20 NOTE — Therapy (Addendum)
[facility] [address]  [place], [place], [address] Phone: [phone]   Fax:  [phone]  Physical Therapy Treatment  Patient Details  Name: Kathryn Dillon MRN: [id number] Date of Birth: 1960/12/27 Referring Provider: Dr Karlton Lemon  Encounter Date: 09/20/2016      PT End of Session - 09/20/16 0802    Visit Number 9   Number of Visits 16   Date for PT Re-Evaluation 10/04/16   PT Start Time 0800   PT Stop Time 0840   PT Time Calculation (min) 40 min   Activity Tolerance Patient tolerated treatment well   Behavior During Therapy [facility] for tasks assessed/performed      Past Medical History:  Diagnosis Date  . COPD (chronic obstructive pulmonary disease) with chronic bronchitis ([place])   . Diabetes mellitus without complication ([place])   . H/O drug abuse   . H/O ETOH abuse   . High cholesterol   . Hypertension   . PAD (peripheral artery disease) (HCC)     No past surgical history on file.  There were no vitals filed for this visit.      Subjective Assessment - 09/20/16 0801    Subjective Feeling good - made a follow-up appt with MD for April; some "joint soreness"   Diagnostic tests Xray - closed nondisplaced fracture of glenoid cavity of L scapula   Patient Stated Goals improve function of L shoulder   Currently in Pain? No/denies   Pain Score 0-No pain                         [facility] Adult PT Treatment/Exercise - 09/20/16 0803      Shoulder Exercises: Supine   Other Supine Exercises serratus punch - 5# x 15 reps     Shoulder Exercises: Prone   Retraction Strengthening;Left;15 reps;Weights   Retraction Weight (lbs) 5   Retraction Limitations prone row with scap squeeze   Extension Strengthening;Left;15 reps;Weights   Extension Weight (lbs) 5   Extension Limitations prone I's with scap squeeze   Horizontal ABduction 1 Strengthening;Left;15 reps;Weights   Horizontal ABduction 1  Weight (lbs) 4   Horizontal ABduction 1 Limitations prone T's with scap squeeze   Horizontal ABduction 2 Strengthening;Left;15 reps;Weights   Horizontal ABduction 2 Weight (lbs) 3   Horizontal ABduction 2 Limitations prone Y's with scap squeeze     Shoulder Exercises: Sidelying   External Rotation Strengthening;Left;15 reps;Weights   External Rotation Weight (lbs) 5     Shoulder Exercises: Standing   External Rotation Strengthening;Left;15 reps;Theraband   Theraband Level (Shoulder External Rotation) Level 3 (Green)   External Rotation Limitations 2 sets - overhead   Internal Rotation Strengthening;Left;15 reps;Theraband   Theraband Level (Shoulder Internal Rotation) Level 3 (Green)   Internal Rotation Limitations 2 sets - overhead   Other Standing Exercises bicep curl 1 x 15 - black tband   Other Standing Exercises overhead press - blue tband 2 x 15     Shoulder Exercises: ROM/Strengthening   UBE (Upper Arm Bike) level 6.5 x 6 minutes (3/3)   Cybex Row Limitations 45# 2 x 15 - 5 sec hold                  PT Short Term Goals - 09/20/16 0802      PT SHORT TERM GOAL #1   Title patient to be independent with HEP (09/06/16)   Status Achieved  PT SHORT TERM GOAL #2   Title patient to improve L shoulder AROM equal to that of R shoulder with no increase in pain (09/06/16)   Status Achieved           PT Long Term Goals - 08/17/16 0929      PT LONG TERM GOAL #1   Title patient to be independent with advanced HEP (10/04/16)   Status On-going     PT LONG TERM GOAL #2   Title patient to demonstrate ability to maintain good postural alignment with ability to self-correct (10/04/16)   Status On-going     PT LONG TERM GOAL #3   Title patient to improve L UE strength to >/= 4+/5 with no increase in pain (10/04/16)   Status On-going     PT LONG TERM GOAL #4   Title patient to report ability ot perform household chores and ADLs with no increase in L shoulder pain (10/04/16)    Status On-going               Plan - 09/20/16 0802    Clinical Impression Statement Anisia tolerable to all increase in resistance today - no pain, only some muscle soreness and fatigue, as expected. Continuing to work on Medical illustrator today with improved form and function. Will plan for discharge at next visit.    PT Treatment/Interventions ADLs/Self Care Home Management;Cryotherapy;Electrical Stimulation;Iontophoresis 61m/ml Dexamethasone;Moist Heat;Ultrasound;Therapeutic exercise;Therapeutic activities;Patient/family education;Manual techniques;Passive range of motion;Vasopneumatic Device;Taping;Dry needling   PT Next Visit Plan L shoulder and periscapular strengthening - madalities prn for pain - discharge next visit   Consulted and Agree with Plan of Care Patient      Patient will benefit from skilled therapeutic intervention in order to improve the following deficits and impairments:  Decreased activity tolerance, Decreased range of motion, Decreased strength, Increased edema, Impaired UE functional use, Pain  Visit Diagnosis: Acute pain of left shoulder  Muscle weakness (generalized)  Abnormal posture     Problem List Patient Active Problem List   Diagnosis Date Noted  . Shoulder injury, left, subsequent encounter 08/03/2016  . Onychocryptosis 03/30/2016  . Atherosclerosis of native artery of both lower extremities with intermittent claudication ([place] 03/23/2016  . Cigarette smoker 01/13/2016  . Obesity (BMI 30-39.9) 11/05/2015  . Substance abuse in remission 11/05/2015  . Chronic obstructive pulmonary disease ([place] 04/27/2015  . Diabetes mellitus ([place] 08/02/2013  . Essential hypertension 08/02/2013  . Hyperlipidemia 08/02/2013     SLanney Gins PT, DPT 09/20/16 8:40 AM  PHYSICAL THERAPY DISCHARGE SUMMARY  Visits from Start of Care: 9  Current functional level related to goals / functional outcomes: See above   Remaining  deficits: See above - good return of function with no pain limiting   Education / Equipment: HEP  Plan: Patient agrees to discharge.  Patient goals were met. Patient is being discharged due to meeting the stated rehab goals.  ?????    Patient not returning to PT for discharge visit - due to weather. Patient likely to have met all goals with ability to perform all ADLs and household chores with no limitations.   SLanney Gins PT, DPT 10/10/16 8:34 AM   [facility] [place] [address]Phone: [phone]  Fax:  [phone] Name: RCythnia OsmunMRN: [id number] of Birth: [birth year]

## 2016-09-21 ENCOUNTER — Ambulatory Visit: Payer: No Typology Code available for payment source | Admitting: Physical Therapy

## 2016-09-22 ENCOUNTER — Ambulatory Visit: Payer: No Typology Code available for payment source | Admitting: Physical Therapy

## 2016-10-24 ENCOUNTER — Encounter (INDEPENDENT_AMBULATORY_CARE_PROVIDER_SITE_OTHER): Payer: Self-pay

## 2016-10-24 ENCOUNTER — Encounter: Payer: Self-pay | Admitting: Family Medicine

## 2016-10-24 ENCOUNTER — Ambulatory Visit (INDEPENDENT_AMBULATORY_CARE_PROVIDER_SITE_OTHER): Payer: Medicaid Other | Admitting: Family Medicine

## 2016-10-24 DIAGNOSIS — S4992XD Unspecified injury of left shoulder and upper arm, subsequent encounter: Secondary | ICD-10-CM | POA: Diagnosis present

## 2016-10-24 MED ORDER — MELOXICAM 15 MG PO TABS: 15.0000 mg | ORAL_TABLET | Freq: Every day | ORAL | 2 refills | Status: DC
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
## 2016-10-24 NOTE — Patient Instructions (Signed)
Continue home exercises 2-3 times a week for 6 more weeks. Take meloxicam  daily with food as needed for pain and inflammation. Call me if you have any problems.

## 2016-10-24 NOTE — Assessment & Plan Note (Signed)
2/2 nondisplaced glenoid fracture.  This healed.  She continues to have some evidence of impingement.  Encouraged home exercises.  We refilled her mobic - cautioned to try to take as needed with her other medical issues.  Continue HEP for 6 more weeks.  Call us with any concerns otherwise will release, f/u prn.

## 2016-11-29 ENCOUNTER — Ambulatory Visit: Payer: Medicaid Other | Admitting: Podiatry

## 2017-03-20 ENCOUNTER — Emergency Department (HOSPITAL_BASED_OUTPATIENT_CLINIC_OR_DEPARTMENT_OTHER)
Admission: EM | Admit: 2017-03-20 | Discharge: 2017-03-20 | Disposition: A | Discharge status: Home / Self Care | Payer: Medicaid Other | Attending: Emergency Medicine | Admitting: Emergency Medicine

## 2017-03-20 ENCOUNTER — Encounter (HOSPITAL_BASED_OUTPATIENT_CLINIC_OR_DEPARTMENT_OTHER): Payer: Self-pay

## 2017-03-20 DIAGNOSIS — Z5321 Procedure and treatment not carried out due to patient leaving prior to being seen by health care provider: Secondary | ICD-10-CM | POA: Insufficient documentation

## 2017-03-20 DIAGNOSIS — M79606 Pain in leg, unspecified: Secondary | ICD-10-CM | POA: Diagnosis not present

## 2017-03-20 NOTE — ED Triage Notes (Signed)
C/o bilat LE muscle cramps x today

## 2017-03-21 ENCOUNTER — Encounter (HOSPITAL_BASED_OUTPATIENT_CLINIC_OR_DEPARTMENT_OTHER): Payer: Self-pay

## 2017-03-21 ENCOUNTER — Emergency Department (HOSPITAL_BASED_OUTPATIENT_CLINIC_OR_DEPARTMENT_OTHER)
Admission: EM | Admit: 2017-03-21 | Discharge: 2017-03-21 | Disposition: A | Discharge status: Home / Self Care | Payer: Medicaid Other | Attending: Emergency Medicine | Admitting: Emergency Medicine

## 2017-03-21 ENCOUNTER — Encounter (HOSPITAL_COMMUNITY): Payer: Self-pay | Admitting: Emergency Medicine

## 2017-03-21 ENCOUNTER — Emergency Department (HOSPITAL_COMMUNITY): Payer: Medicaid Other

## 2017-03-21 ENCOUNTER — Inpatient Hospital Stay (HOSPITAL_COMMUNITY)
Admission: EM | Admit: 2017-03-21 | Discharge: 2017-03-26 | DRG: 854 | Disposition: A | Discharge status: Home Health | Payer: Medicaid Other | Attending: Internal Medicine | Admitting: Internal Medicine

## 2017-03-21 DIAGNOSIS — L0231 Cutaneous abscess of buttock: Secondary | ICD-10-CM | POA: Diagnosis present

## 2017-03-21 DIAGNOSIS — E7800 Pure hypercholesterolemia, unspecified: Secondary | ICD-10-CM | POA: Diagnosis present

## 2017-03-21 DIAGNOSIS — Z8249 Family history of ischemic heart disease and other diseases of the circulatory system: Secondary | ICD-10-CM

## 2017-03-21 DIAGNOSIS — A419 Sepsis, unspecified organism: Secondary | ICD-10-CM | POA: Diagnosis present

## 2017-03-21 DIAGNOSIS — E669 Obesity, unspecified: Secondary | ICD-10-CM | POA: Diagnosis present

## 2017-03-21 DIAGNOSIS — I959 Hypotension, unspecified: Secondary | ICD-10-CM | POA: Diagnosis present

## 2017-03-21 DIAGNOSIS — Z833 Family history of diabetes mellitus: Secondary | ICD-10-CM

## 2017-03-21 DIAGNOSIS — E1151 Type 2 diabetes mellitus with diabetic peripheral angiopathy without gangrene: Secondary | ICD-10-CM | POA: Diagnosis present

## 2017-03-21 DIAGNOSIS — R5081 Fever presenting with conditions classified elsewhere: Secondary | ICD-10-CM

## 2017-03-21 DIAGNOSIS — J449 Chronic obstructive pulmonary disease, unspecified: Secondary | ICD-10-CM | POA: Diagnosis present

## 2017-03-21 DIAGNOSIS — B954 Other streptococcus as the cause of diseases classified elsewhere: Secondary | ICD-10-CM | POA: Diagnosis present

## 2017-03-21 DIAGNOSIS — Z5321 Procedure and treatment not carried out due to patient leaving prior to being seen by health care provider: Secondary | ICD-10-CM | POA: Diagnosis not present

## 2017-03-21 DIAGNOSIS — I10 Essential (primary) hypertension: Secondary | ICD-10-CM | POA: Diagnosis present

## 2017-03-21 DIAGNOSIS — E119 Type 2 diabetes mellitus without complications: Secondary | ICD-10-CM

## 2017-03-21 DIAGNOSIS — Z794 Long term (current) use of insulin: Secondary | ICD-10-CM

## 2017-03-21 DIAGNOSIS — F17210 Nicotine dependence, cigarettes, uncomplicated: Secondary | ICD-10-CM | POA: Diagnosis present

## 2017-03-21 DIAGNOSIS — Z9981 Dependence on supplemental oxygen: Secondary | ICD-10-CM

## 2017-03-21 DIAGNOSIS — D649 Anemia, unspecified: Secondary | ICD-10-CM | POA: Diagnosis present

## 2017-03-21 DIAGNOSIS — F1410 Cocaine abuse, uncomplicated: Secondary | ICD-10-CM | POA: Diagnosis present

## 2017-03-21 DIAGNOSIS — E785 Hyperlipidemia, unspecified: Secondary | ICD-10-CM | POA: Diagnosis present

## 2017-03-21 DIAGNOSIS — I2510 Atherosclerotic heart disease of native coronary artery without angina pectoris: Secondary | ICD-10-CM | POA: Diagnosis present

## 2017-03-21 DIAGNOSIS — N179 Acute kidney failure, unspecified: Secondary | ICD-10-CM | POA: Diagnosis present

## 2017-03-21 DIAGNOSIS — Z955 Presence of coronary angioplasty implant and graft: Secondary | ICD-10-CM

## 2017-03-21 DIAGNOSIS — K611 Rectal abscess: Secondary | ICD-10-CM | POA: Diagnosis present

## 2017-03-21 NOTE — ED Notes (Signed)
Per registration staff, pt left d/t wait time.

## 2017-03-21 NOTE — ED Triage Notes (Signed)
Pt comes from home via EMS.  Pt reports having a cough for the past 3 days.  Pain is reproducible by palpation and movement.  Also was in the ED a few days ago for low potassium. BP 140/100, HR 118, with hx of HTN.

## 2017-03-21 NOTE — ED Triage Notes (Signed)
C/o right buttock abscess x 2 days-NAD-steady gait

## 2017-03-22 ENCOUNTER — Emergency Department (HOSPITAL_COMMUNITY): Payer: Medicaid Other

## 2017-03-22 ENCOUNTER — Inpatient Hospital Stay (HOSPITAL_COMMUNITY): Payer: Medicaid Other

## 2017-03-22 ENCOUNTER — Encounter (HOSPITAL_COMMUNITY): Payer: Self-pay

## 2017-03-22 DIAGNOSIS — Z9981 Dependence on supplemental oxygen: Secondary | ICD-10-CM | POA: Diagnosis not present

## 2017-03-22 DIAGNOSIS — Z8249 Family history of ischemic heart disease and other diseases of the circulatory system: Secondary | ICD-10-CM | POA: Diagnosis not present

## 2017-03-22 DIAGNOSIS — A419 Sepsis, unspecified organism: Secondary | ICD-10-CM | POA: Diagnosis present

## 2017-03-22 DIAGNOSIS — E118 Type 2 diabetes mellitus with unspecified complications: Secondary | ICD-10-CM | POA: Diagnosis not present

## 2017-03-22 DIAGNOSIS — Z955 Presence of coronary angioplasty implant and graft: Secondary | ICD-10-CM | POA: Diagnosis not present

## 2017-03-22 DIAGNOSIS — R079 Chest pain, unspecified: Secondary | ICD-10-CM

## 2017-03-22 DIAGNOSIS — F1410 Cocaine abuse, uncomplicated: Secondary | ICD-10-CM | POA: Diagnosis present

## 2017-03-22 DIAGNOSIS — I10 Essential (primary) hypertension: Secondary | ICD-10-CM | POA: Diagnosis present

## 2017-03-22 DIAGNOSIS — I959 Hypotension, unspecified: Secondary | ICD-10-CM | POA: Diagnosis present

## 2017-03-22 DIAGNOSIS — J439 Emphysema, unspecified: Secondary | ICD-10-CM | POA: Diagnosis not present

## 2017-03-22 DIAGNOSIS — Z794 Long term (current) use of insulin: Secondary | ICD-10-CM | POA: Diagnosis not present

## 2017-03-22 DIAGNOSIS — E785 Hyperlipidemia, unspecified: Secondary | ICD-10-CM | POA: Diagnosis present

## 2017-03-22 DIAGNOSIS — A408 Other streptococcal sepsis: Secondary | ICD-10-CM | POA: Diagnosis not present

## 2017-03-22 DIAGNOSIS — B954 Other streptococcus as the cause of diseases classified elsewhere: Secondary | ICD-10-CM | POA: Diagnosis present

## 2017-03-22 DIAGNOSIS — E0800 Diabetes mellitus due to underlying condition with hyperosmolarity without nonketotic hyperglycemic-hyperosmolar coma (NKHHC): Secondary | ICD-10-CM | POA: Diagnosis not present

## 2017-03-22 DIAGNOSIS — E669 Obesity, unspecified: Secondary | ICD-10-CM | POA: Diagnosis present

## 2017-03-22 DIAGNOSIS — I2510 Atherosclerotic heart disease of native coronary artery without angina pectoris: Secondary | ICD-10-CM | POA: Diagnosis present

## 2017-03-22 DIAGNOSIS — F17210 Nicotine dependence, cigarettes, uncomplicated: Secondary | ICD-10-CM | POA: Diagnosis present

## 2017-03-22 DIAGNOSIS — N179 Acute kidney failure, unspecified: Secondary | ICD-10-CM | POA: Diagnosis present

## 2017-03-22 DIAGNOSIS — Z833 Family history of diabetes mellitus: Secondary | ICD-10-CM | POA: Diagnosis not present

## 2017-03-22 DIAGNOSIS — E7800 Pure hypercholesterolemia, unspecified: Secondary | ICD-10-CM | POA: Diagnosis present

## 2017-03-22 DIAGNOSIS — K611 Rectal abscess: Secondary | ICD-10-CM | POA: Diagnosis present

## 2017-03-22 DIAGNOSIS — D649 Anemia, unspecified: Secondary | ICD-10-CM | POA: Diagnosis present

## 2017-03-22 DIAGNOSIS — L0231 Cutaneous abscess of buttock: Secondary | ICD-10-CM | POA: Diagnosis not present

## 2017-03-22 DIAGNOSIS — E1151 Type 2 diabetes mellitus with diabetic peripheral angiopathy without gangrene: Secondary | ICD-10-CM | POA: Diagnosis present

## 2017-03-22 DIAGNOSIS — I2583 Coronary atherosclerosis due to lipid rich plaque: Secondary | ICD-10-CM | POA: Diagnosis not present

## 2017-03-22 DIAGNOSIS — J449 Chronic obstructive pulmonary disease, unspecified: Secondary | ICD-10-CM | POA: Diagnosis present

## 2017-03-22 LAB — RAPID URINE DRUG SCREEN, HOSP PERFORMED
Amphetamines: NOT DETECTED
Barbiturates: NOT DETECTED
Benzodiazepines: NOT DETECTED
Cocaine: POSITIVE — AB
Opiates: POSITIVE — AB
Tetrahydrocannabinol: NOT DETECTED

## 2017-03-22 LAB — I-STAT CG4 LACTIC ACID, ED
Lactic Acid, Venous: 1.61 mmol/L (ref 0.5–1.9)
Lactic Acid, Venous: 1.14 mmol/L (ref 0.5–1.9)

## 2017-03-22 LAB — GLUCOSE, CAPILLARY
Glucose-Capillary: 359 mg/dL — ABNORMAL HIGH (ref 65–99)
Glucose-Capillary: 394 mg/dL — ABNORMAL HIGH (ref 65–99)
Glucose-Capillary: 422 mg/dL — ABNORMAL HIGH (ref 65–99)

## 2017-03-22 LAB — LIPID PANEL
Cholesterol: 124 mg/dL (ref 0–200)
HDL: 15 mg/dL — ABNORMAL LOW (ref >40)
LDL Cholesterol: 60 mg/dL (ref 0–99)
Total CHOL/HDL Ratio: 8.3 RATIO
Triglycerides: 244 mg/dL — ABNORMAL HIGH (ref <150)
VLDL: 49 mg/dL — ABNORMAL HIGH (ref 0–40)

## 2017-03-22 LAB — CBC
HCT: 28.2 % — ABNORMAL LOW (ref 36.0–46.0)
Hemoglobin: 9.4 g/dL — ABNORMAL LOW (ref 12.0–15.0)
MCH: 28.1 pg (ref 26.0–34.0)
MCHC: 33.3 g/dL (ref 30.0–36.0)
MCV: 84.2 fL (ref 78.0–100.0)
Platelets: 244 10*3/uL (ref 150–400)
RBC: 3.35 MIL/uL — ABNORMAL LOW (ref 3.87–5.11)
RDW: 13.4 % (ref 11.5–15.5)
WBC: 15.8 10*3/uL — ABNORMAL HIGH (ref 4.0–10.5)

## 2017-03-22 LAB — ECHOCARDIOGRAM COMPLETE
Height: 64 in
Weight: 3315.72 oz

## 2017-03-22 LAB — MRSA PCR SCREENING: MRSA by PCR: NEGATIVE

## 2017-03-22 LAB — TROPONIN I: Troponin I: <0.03 ng/mL (ref <0.03)

## 2017-03-22 LAB — BASIC METABOLIC PANEL
Anion gap: 13 (ref 5–15)
BUN: 21 mg/dL — ABNORMAL HIGH (ref 6–20)
CO2: 21 mmol/L — ABNORMAL LOW (ref 22–32)
Calcium: 8.7 mg/dL — ABNORMAL LOW (ref 8.9–10.3)
Chloride: 99 mmol/L — ABNORMAL LOW (ref 101–111)
Creatinine, Ser: 1.44 mg/dL — ABNORMAL HIGH (ref 0.44–1.00)
GFR calc Af Amer: 46 mL/min — ABNORMAL LOW (ref >60)
GFR calc non Af Amer: 40 mL/min — ABNORMAL LOW (ref >60)
Glucose, Bld: 252 mg/dL — ABNORMAL HIGH (ref 65–99)
Potassium: 3 mmol/L — ABNORMAL LOW (ref 3.5–5.1)
Sodium: 133 mmol/L — ABNORMAL LOW (ref 135–145)

## 2017-03-22 LAB — URINALYSIS, ROUTINE W REFLEX MICROSCOPIC
Bilirubin Urine: NEGATIVE
Glucose, UA: 150 mg/dL — AB
Ketones, ur: NEGATIVE mg/dL
Leukocytes, UA: NEGATIVE
Nitrite: NEGATIVE
Protein, ur: 100 mg/dL — AB
Specific Gravity, Urine: 1.039 — ABNORMAL HIGH (ref 1.005–1.030)
pH: 5 (ref 5.0–8.0)

## 2017-03-22 LAB — HEMOGLOBIN A1C
Hgb A1c MFr Bld: 9.7 % — ABNORMAL HIGH (ref 4.8–5.6)
Mean Plasma Glucose: 231.69 mg/dL

## 2017-03-22 LAB — MAGNESIUM: Magnesium: 2 mg/dL (ref 1.7–2.4)

## 2017-03-22 MED ORDER — TECHNETIUM TC 99M DIETHYLENETRIAME-PENTAACETIC ACID
32.5000 | Freq: Once | INTRAVENOUS | Status: AC
  Administered 2017-03-22: 32.5 via RESPIRATORY_TRACT

## 2017-03-22 MED ORDER — TECHNETIUM TO 99M ALBUMIN AGGREGATED
4.1000 | Freq: Once | INTRAVENOUS | Status: AC
  Administered 2017-03-22: 4.1 via INTRAVENOUS

## 2017-03-22 MED ORDER — LIDOCAINE HCL (PF) 2 % IJ SOLN
INTRAMUSCULAR | Status: AC
  Administered 2017-03-22: 09:00:00
  Filled 2017-03-22: qty 20

## 2017-03-22 MED ORDER — FENTANYL CITRATE (PF) 100 MCG/2ML IJ SOLN
50.0000 ug | INTRAMUSCULAR | Status: DC | PRN
  Administered 2017-03-22 – 2017-03-24 (×10): 50 ug via INTRAVENOUS
  Filled 2017-03-22 (×11): qty 2

## 2017-03-22 MED ORDER — ACETAMINOPHEN 325 MG PO TABS
650.0000 mg | ORAL_TABLET | Freq: Once | ORAL | Status: AC
  Administered 2017-03-22: 650 mg via ORAL
  Filled 2017-03-22: qty 2

## 2017-03-22 MED ORDER — INSULIN ASPART PROT & ASPART (70-30 MIX) 100 UNIT/ML ~~LOC~~ SUSP
40.0000 [IU] | Freq: Two times a day (BID) | SUBCUTANEOUS | Status: DC
  Administered 2017-03-23: 40 [IU] via SUBCUTANEOUS

## 2017-03-22 MED ORDER — INSULIN ASPART 100 UNIT/ML ~~LOC~~ SOLN
8.0000 [IU] | Freq: Once | SUBCUTANEOUS | Status: AC
  Administered 2017-03-22: 8 [IU] via SUBCUTANEOUS

## 2017-03-22 MED ORDER — LIDOCAINE HCL (PF) 1 % IJ SOLN: 30.0000 mL | Freq: Once | INTRAMUSCULAR | Status: DC

## 2017-03-22 MED ORDER — VANCOMYCIN HCL IN DEXTROSE 750-5 MG/150ML-% IV SOLN
750.0000 mg | Freq: Two times a day (BID) | INTRAVENOUS | Status: DC
  Administered 2017-03-22 – 2017-03-24 (×4): 750 mg via INTRAVENOUS
  Filled 2017-03-22 (×7): qty 150

## 2017-03-22 MED ORDER — CLINDAMYCIN PHOSPHATE 900 MG/50ML IV SOLN
900.0000 mg | Freq: Once | INTRAVENOUS | Status: AC
  Administered 2017-03-22: 900 mg via INTRAVENOUS
  Filled 2017-03-22: qty 50

## 2017-03-22 MED ORDER — LIDOCAINE HCL (PF) 2 % IJ SOLN
INTRAMUSCULAR | Status: AC
  Filled 2017-03-22: qty 10

## 2017-03-22 MED ORDER — POTASSIUM CHLORIDE IN NACL 40-0.9 MEQ/L-% IV SOLN
INTRAVENOUS | Status: DC
  Administered 2017-03-22 – 2017-03-26 (×6): 125 mL/h via INTRAVENOUS
  Filled 2017-03-22 (×12): qty 1000

## 2017-03-22 MED ORDER — PROPOFOL 10 MG/ML IV BOLUS
0.5000 mg/kg | Freq: Once | INTRAVENOUS | Status: DC
  Filled 2017-03-22: qty 20

## 2017-03-22 MED ORDER — POTASSIUM CHLORIDE CRYS ER 20 MEQ PO TBCR
40.0000 meq | EXTENDED_RELEASE_TABLET | Freq: Once | ORAL | Status: AC
  Administered 2017-03-22: 40 meq via ORAL
  Filled 2017-03-22: qty 2

## 2017-03-22 MED ORDER — IOPAMIDOL (ISOVUE-300) INJECTION 61%
100.0000 mL | Freq: Once | INTRAVENOUS | Status: AC | PRN
  Administered 2017-03-22: 80 mL via INTRAVENOUS

## 2017-03-22 MED ORDER — PIPERACILLIN-TAZOBACTAM 3.375 G IVPB
3.3750 g | Freq: Three times a day (TID) | INTRAVENOUS | Status: DC
  Administered 2017-03-22 – 2017-03-24 (×7): 3.375 g via INTRAVENOUS
  Filled 2017-03-22 (×8): qty 50

## 2017-03-22 MED ORDER — SODIUM CHLORIDE 0.9 % IV BOLUS (SEPSIS)
1000.0000 mL | Freq: Once | INTRAVENOUS | Status: AC
  Administered 2017-03-22: 1000 mL via INTRAVENOUS

## 2017-03-22 MED ORDER — ONDANSETRON HCL 4 MG PO TABS: 4.0000 mg | ORAL_TABLET | Freq: Four times a day (QID) | ORAL | Status: DC | PRN

## 2017-03-22 MED ORDER — ENOXAPARIN SODIUM 40 MG/0.4ML ~~LOC~~ SOLN
40.0000 mg | SUBCUTANEOUS | Status: DC
  Administered 2017-03-22 – 2017-03-25 (×3): 40 mg via SUBCUTANEOUS
  Filled 2017-03-22 (×4): qty 0.4

## 2017-03-22 MED ORDER — INSULIN ASPART 100 UNIT/ML ~~LOC~~ SOLN
0.0000 [IU] | Freq: Three times a day (TID) | SUBCUTANEOUS | Status: DC
  Administered 2017-03-22 – 2017-03-23 (×3): 9 [IU] via SUBCUTANEOUS
  Administered 2017-03-23: 5 [IU] via SUBCUTANEOUS
  Administered 2017-03-23: 7 [IU] via SUBCUTANEOUS
  Administered 2017-03-24 – 2017-03-25 (×3): 2 [IU] via SUBCUTANEOUS
  Administered 2017-03-25: 1 [IU] via SUBCUTANEOUS

## 2017-03-22 MED ORDER — MAGNESIUM SULFATE 2 GM/50ML IV SOLN
2.0000 g | Freq: Once | INTRAVENOUS | Status: AC
  Administered 2017-03-22: 2 g via INTRAVENOUS
  Filled 2017-03-22: qty 50

## 2017-03-22 MED ORDER — PIPERACILLIN-TAZOBACTAM 3.375 G IVPB 30 MIN
3.3750 g | Freq: Once | INTRAVENOUS | Status: AC
  Administered 2017-03-22: 3.375 g via INTRAVENOUS
  Filled 2017-03-22: qty 50

## 2017-03-22 MED ORDER — SODIUM CHLORIDE 0.9 % IV BOLUS (SEPSIS)
1500.0000 mL | Freq: Once | INTRAVENOUS | Status: AC
  Administered 2017-03-22: 1000 mL via INTRAVENOUS

## 2017-03-22 MED ORDER — INSULIN ASPART PROT & ASPART (70-30 MIX) 100 UNIT/ML ~~LOC~~ SUSP
15.0000 [IU] | Freq: Once | SUBCUTANEOUS | Status: AC
  Administered 2017-03-22: 15 [IU] via SUBCUTANEOUS

## 2017-03-22 MED ORDER — HYDROCODONE-ACETAMINOPHEN 5-325 MG PO TABS
1.0000 | ORAL_TABLET | Freq: Four times a day (QID) | ORAL | Status: DC | PRN
  Administered 2017-03-22 – 2017-03-24 (×5): 1 via ORAL
  Filled 2017-03-22 (×5): qty 1

## 2017-03-22 MED ORDER — SODIUM CHLORIDE 0.9 % IV SOLN
INTRAVENOUS | Status: DC
  Administered 2017-03-22: 06:00:00 via INTRAVENOUS

## 2017-03-22 MED ORDER — IOPAMIDOL (ISOVUE-300) INJECTION 61%
INTRAVENOUS | Status: AC
  Administered 2017-03-22: 80 mL via INTRAVENOUS
  Filled 2017-03-22: qty 100

## 2017-03-22 MED ORDER — VANCOMYCIN HCL IN DEXTROSE 1-5 GM/200ML-% IV SOLN
1000.0000 mg | Freq: Once | INTRAVENOUS | Status: AC
  Administered 2017-03-22: 1000 mg via INTRAVENOUS
  Filled 2017-03-22: qty 200

## 2017-03-22 MED ORDER — ONDANSETRON HCL 4 MG/2ML IJ SOLN: 4.0000 mg | Freq: Four times a day (QID) | INTRAMUSCULAR | Status: DC | PRN

## 2017-03-22 MED ORDER — ACETAMINOPHEN 325 MG PO TABS
650.0000 mg | ORAL_TABLET | Freq: Four times a day (QID) | ORAL | Status: DC | PRN
  Administered 2017-03-22: 650 mg via ORAL
  Filled 2017-03-22: qty 2

## 2017-03-22 MED ORDER — INSULIN ASPART PROT & ASPART (70-30 MIX) 100 UNIT/ML ~~LOC~~ SUSP
35.0000 [IU] | Freq: Two times a day (BID) | SUBCUTANEOUS | Status: DC
  Administered 2017-03-22: 35 [IU] via SUBCUTANEOUS
  Filled 2017-03-22: qty 10

## 2017-03-22 MED ORDER — ACETAMINOPHEN 650 MG RE SUPP: 650.0000 mg | Freq: Four times a day (QID) | RECTAL | Status: DC | PRN

## 2017-03-22 MED ORDER — SODIUM CHLORIDE 0.9 % IV BOLUS (SEPSIS)
500.0000 mL | Freq: Once | INTRAVENOUS | Status: AC
  Administered 2017-03-22: 500 mL via INTRAVENOUS

## 2017-03-22 MED ORDER — LEVALBUTEROL HCL 0.63 MG/3ML IN NEBU: 0.6300 mg | INHALATION_SOLUTION | Freq: Four times a day (QID) | RESPIRATORY_TRACT | Status: DC | PRN

## 2017-03-22 NOTE — Progress Notes (Signed)
Report given to [facility] on 4th floor. Pt to transport to rm [id number] via bed.

## 2017-03-22 NOTE — ED Notes (Signed)
Pt also reports an abscess along the buttocks on the right side.  No opening or drainage noted to abscess. Swelling noted.

## 2017-03-22 NOTE — H&P (Signed)
[facility] History and Physical  Kathryn Dillon WGN:[id number] DOB: May 03, 1961 DOA: 03/21/2017  Referring physician:  ED  PCP: Jackie Plum, MD   Chief Complaint: Chest pain  HPI:  56 year old  female with a history of diabetes,  cocaine use, hypertension, dyslipidemia, COPD, apparently also with a history of coronary artery disease, who presented to the emergency room  with a chief complaint of chest pain described as substernal in location, associated with a slight cough without any associated symptoms of palpitations or shortness of breath. Patient admitted to ongoing cocaine use. Patient also complained of right buttock pain, no fever, no chills. Patient states that she has a history of coronary artery disease with stents in the past but lost follow-up to cardiology. Patient also has a history of peripheral vascular disease and is followed by vascular surgery in [facility]. She also complained of bilateral lower extremity pain and claudication with walking. ED course BP (!) 144/64 (BP Location: Right Arm)   Pulse (!) 114   Temp 97.8 F (36.6 C) (Oral)   Resp 18   SpO2 97% Blood pressure dropped to 80 systolic overnight  Troponin negative 1, lactic acid 1.6, sodium 133. Potassium 3.0. White count 15.8. Glucose 252. Patient found to have a large indurated area in the right buttock. CT pelvis showed large gas containing abscess in the right ischio rectal fossa  . General surgery Dr. Francena Hanly was consulted and the patient had a bedside incision and drainage in the ED. Patient is being admitted to step down for sepsis      Review of Systems: negative for the following   A complete 12 point review of systems was done with pertinent positives in history of present illness Entire review of systems was otherwise negative    Past Medical History:  Diagnosis Date  . COPD (chronic obstructive pulmonary disease) with chronic bronchitis (HCC)   . Diabetes mellitus  without complication (HCC)   . H/O drug abuse   . H/O ETOH abuse   . High cholesterol   . Hypertension   . PAD (peripheral artery disease) (HCC)      History reviewed. No pertinent surgical history.    Social History:  reports that she has been smoking Cigarettes.  She has never used smokeless tobacco. She reports that she does not drink alcohol or use drugs.    Allergies  Allergen Reactions  . Lisinopril Cough      Family History  Problem Relation Age of Onset  . Hypertension Mother  . Diabetes Father  . Diabetes Brother  . Hypertension Brother  . Hypertension Maternal Aunt  . Cancer Maternal Grandmother  . Heart disease Maternal Grandfather         Prior to Admission medications   Medication Sig Start Date End Date Taking? Authorizing Provider  insulin NPH-regular Human (HUMULIN 70/30) (70-30) 100 UNIT/ML injection Inject 80 Units into the skin 2 (two) times daily with a meal.   Yes [provider]  diclofenac (VOLTAREN) 75 MG EC tablet Take 1 tablet (75 mg total) by mouth 2 (two) times daily. Patient not taking: Reported on 03/22/2017 08/02/16   Lenda Kelp, MD  HYDROcodone-acetaminophen (NORCO/VICODIN) 5-325 MG tablet Take 1 tablet by mouth every 6 (six) hours as needed for severe pain. Patient not taking: Reported on 03/22/2017 08/22/16   Ward, Chase Picket, PA-C  meloxicam (MOBIC) 15 MG tablet Take 1 tablet (15 mg total) by mouth daily. Patient not taking: Reported on 03/22/2017 10/24/16  Lenda Kelp, MD  OXYGEN Inhale into the lungs as needed. 2L [place] PRN    [provider]     Physical Exam: Vitals:   03/22/17 0300 03/22/17 0330 03/22/17 0544 03/22/17 0658  BP: 106/72 110/71 (!) 101/58 (!) 81/45  Pulse: (!) 107 (!) 105 97 (!) 102  Resp: Temp:   99.8 F (37.7 C) 100.1 F (37.8 C)  TempSrc:   Oral Oral  SpO2: 93% 96% 97% 100%        Vitals:   03/22/17 0300 03/22/17 0330 03/22/17 0544 03/22/17 0658  BP: 106/72  110/71 (!) 101/58 (!) 81/45  Pulse: (!) 107 (!) 105 97 (!) 102  Resp: Temp:   99.8 F (37.7 C) 100.1 F (37.8 C)  TempSrc:   Oral Oral  SpO2: 93% 96% 97% 100%   Constitutional: NAD, calm, comfortable Eyes: PERRL, lids and conjunctivae normal ENMT: Mucous membranes are moist. Posterior pharynx clear of any exudate or lesions.Normal dentition.  Neck: normal, supple, no masses, no thyromegaly Respiratory: clear to auscultation bilaterally, no wheezing, no crackles. Normal respiratory effort. No accessory muscle use.  Cardiovascular: Regular rate and rhythm, no murmurs / rubs / gallops. No extremity edema. 2+ pedal pulses. No carotid bruits.  Abdomen: no tenderness, no masses palpated. No hepatosplenomegaly. Bowel sounds positive.  Musculoskeletal: no clubbing / cyanosis. No joint deformity upper and lower extremities. Good ROM, no contractures. Normal muscle tone.  Skin: no rashes, lesions, ulcers. No induration Neurologic: CN 2-12 grossly intact. Sensation intact, DTR normal. Strength 5/5 in all 4.  Psychiatric: Normal judgment and insight. Alert and oriented x 3. Normal mood.     Labs on Admission: I have personally reviewed following labs and imaging studies  CBC:  Recent Labs Lab 03/22/17 0031  WBC 15.8*  HGB 9.4*  HCT 28.2*  MCV 84.2  PLT 244    Basic Metabolic Panel:  Recent Labs Lab 03/22/17 0031  NA 133*  K 3.0*  CL 99*  CO2 21*  GLUCOSE 252*  BUN 21*  CREATININE 1.44*  CALCIUM 8.7*    GFR: Estimated Creatinine Clearance: 49.5 mL/min (A) (by C-G formula based on SCr of 1.44 mg/dL (H)).  Liver Function Tests: No results for input(s): AST, ALT, ALKPHOS, BILITOT, PROT, ALBUMIN in the last 168 hours. No results for input(s): LIPASE, AMYLASE in the last 168 hours. No results for input(s): AMMONIA in the last 168 hours.  Coagulation Profile: No results for input(s): INR, PROTIME in the last 168 hours. No results for input(s): DDIMER in the last  72 hours.  Cardiac Enzymes:  Recent Labs Lab 03/22/17 0030  TROPONINI <0.03    BNP (last 3 results) No results for input(s): PROBNP in the last 8760 hours.  HbA1C: No results for input(s): HGBA1C in the last 72 hours. No results found for: HGBA1C   CBG: No results for input(s): GLUCAP in the last 168 hours.  Lipid Profile: No results for input(s): CHOL, HDL, LDLCALC, TRIG, CHOLHDL, LDLDIRECT in the last 72 hours.  Thyroid Function Tests: No results for input(s): TSH, T4TOTAL, FREET4, T3FREE, THYROIDAB in the last 72 hours.  Anemia Panel: No results for input(s): VITAMINB12, FOLATE, FERRITIN, TIBC, IRON, RETICCTPCT in the last 72 hours.  Urine analysis: No results found for: COLORURINE, APPEARANCEUR, LABSPEC, PHURINE, GLUCOSEU, HGBUR, BILIRUBINUR, KETONESUR, PROTEINUR, UROBILINOGEN, NITRITE, LEUKOCYTESUR  Sepsis Labs: (procalcitonin:4,lacticidven:4) )No results found for this or any previous visit (from the past 240 hour(s)).  Radiological Exams on Admission: Dg Chest 2 View  Result Date: 03/22/2017 CLINICAL DATA:  Chest pain EXAM: CHEST  2 VIEW COMPARISON:  None. FINDINGS: The heart size and mediastinal contours are within normal limits. Both lungs are clear. The visualized skeletal structures are unremarkable. IMPRESSION: No active cardiopulmonary disease. Electronically Signed   By: Deatra Robinson M.D.   On: 03/22/2017 00:19   Ct Pelvis W Contrast  Result Date: 03/22/2017 CLINICAL DATA:  Abscess along the right side of the buttocks. White cell count 15.8. History of hypertension and diabetes. EXAM: CT PELVIS WITH CONTRAST TECHNIQUE: Multidetector CT imaging of the pelvis was performed using the standard protocol following the bolus administration of intravenous contrast. CONTRAST:  80 mL Isovue-300 COMPARISON:  None. FINDINGS: Urinary Tract: Bladder wall is not thickened and no filling defects are identified. Bowel: Visualized portions of the colon and  small bowel are normal in caliber without inflammatory change. Vascular/Lymphatic: Calcification in the iliac arteries. Reproductive:  Uterus and ovaries are not enlarged. Other: Infiltration throughout the peroneal fat in the right ischiorectal fossa and extending superiorly to the level of the inferior coccyx. Soft tissue infiltration and gas collection extend to the midline at the gluteal crease. Largest collection measures about 4.4 x 8.7 cm. Changes are consistent with abscess and cellulitis caused by gas-forming organism. Musculoskeletal: Mild degenerative changes in the hips. Partial sacralization of L5. No destructive bone lesions. IMPRESSION: Large gas containing abscess in the right ischiorectal fossa with surrounding cellulitis. Electronically Signed   By: Burman Nieves M.D.   On: 03/22/2017 04:12   Dg Chest 2 View  Result Date: 03/22/2017 CLINICAL DATA:  Chest pain EXAM: CHEST  2 VIEW COMPARISON:  None. FINDINGS: The heart size and mediastinal contours are within normal limits. Both lungs are clear. The visualized skeletal structures are unremarkable. IMPRESSION: No active cardiopulmonary disease. Electronically Signed   By: Deatra Robinson M.D.   On: 03/22/2017 00:19   Ct Pelvis W Contrast  Result Date: 03/22/2017 CLINICAL DATA:  Abscess along the right side of the buttocks. White cell count 15.8. History of hypertension and diabetes. EXAM: CT PELVIS WITH CONTRAST TECHNIQUE: Multidetector CT imaging of the pelvis was performed using the standard protocol following the bolus administration of intravenous contrast. CONTRAST:  80 mL Isovue-300 COMPARISON:  None. FINDINGS: Urinary Tract: Bladder wall is not thickened and no filling defects are identified. Bowel: Visualized portions of the colon and small bowel are normal in caliber without inflammatory change. Vascular/Lymphatic: Calcification in the iliac arteries. Reproductive:  Uterus and ovaries are not enlarged. Other: Infiltration throughout  the peroneal fat in the right ischiorectal fossa and extending superiorly to the level of the inferior coccyx. Soft tissue infiltration and gas collection extend to the midline at the gluteal crease. Largest collection measures about 4.4 x 8.7 cm. Changes are consistent with abscess and cellulitis caused by gas-forming organism. Musculoskeletal: Mild degenerative changes in the hips. Partial sacralization of L5. No destructive bone lesions. IMPRESSION: Large gas containing abscess in the right ischiorectal fossa with surrounding cellulitis. Electronically Signed   By: Burman Nieves M.D.   On: 03/22/2017 04:12      EKG: Independently reviewed. None   Assessment/Plan Principal Problem:   Sepsis (HCC) Likely secondary to buttock abscess Bedside incision and drainage to be performed by Dr.Kinsinger Follow blood culture results Patient has been initiated on broad-spectrum antibiotics, vancomycin, Zosyn, clindamycin No lactic acidosis on admission  Chest pain Atypical, could not find any previous evidence of stents in  her heart Will obtain VQ scan to rule out PE given morbid obesity, unable to get IV contrast to perform a CT PE protocol D-dimer will probably be high in the setting of buttock abscess She doesn't multiple cardiovascular risk factors Will perform echo to rule out wall motion abnormalities Continue to cycle enzymes, if they remain stable could consider inpatient versus outpatient stress test She could also have vasospasm in the setting of ongoing cocaine use, UDS pending    Chronic obstructive pulmonary disease (HCC)-stable    Diabetes mellitus (HCC)-check hemoglobin A1c, continue insulin 70/30, SSI    Essential hypertension-hypotensive hold all antihypertensive medications    Hyperlipidemia-check lipid panel    Obesity (BMI 30-39.9)    DVT prophylaxis: * Lovenox     Code Status Orders Full code         consults called:General surgery  Family Communication:  Admission, patients condition and plan of care including tests being ordered have been discussed with the patient  who indicates understanding and agree with the plan and Code Status  Admission status: inpatient    Disposition plan: Further plan will depend as patient's clinical course evolves and further radiologic and laboratory data become available. Likely home when stable   At the time of admission, it appears that the appropriate admission status for this patient is INPATIENT .Thisis judged to be reasonable and necessary in order to provide the required intensity of service to ensure the patient's safetygiven thepresenting symptoms, physical exam findings, and initial radiographic and laboratory data in the context of their chronic comorbidities.   Richarda Overlie MD [facility] Pager [phone]  If 7PM-7AM, please contact night-coverage [email or web address] Password [email or web address]  03/22/2017, 8:50 AM

## 2017-03-22 NOTE — Progress Notes (Addendum)
  Echocardiogram 2D Echocardiogram has been performed.  Patient asked me to stop exam due to difficulty breathing.    Kathryn Dillon 03/22/2017, 2:17 PM

## 2017-03-22 NOTE — Progress Notes (Signed)
Pharmacy Antibiotic Note  Kathryn Dillon is a 56 y.o. female admitted on 03/21/2017 with sepsis.  Pharmacy has been consulted for vanc/Zosyn dosing.  Plan: Vancomycin 1g x 1 then  IV every 12 hours.  Goal trough 15-20 mcg/mL. Zosyn 3.375g IV q8h (4 hour infusion).     Temp (24hrs), Avg:100.2 F (37.9 C), Min:97.8 F (36.6 C), Max:102.8 F (39.3 C)   Recent Labs Lab 03/22/17 0031 03/22/17 0148 03/22/17 0522  WBC 15.8*  --   --   CREATININE 1.44*  --   --   LATICACIDVEN  --  1.61 1.14    Estimated Creatinine Clearance: 49.5 mL/min (A) (by C-G formula based on SCr of 1.44 mg/dL (H)).    Allergies  Allergen Reactions  . Lisinopril Cough     Thank you for allowing pharmacy to be a part of this patient's care.   Hessie Knows, PharmD, BCPS Pager [phone] 03/22/2017 8:12 AM

## 2017-03-22 NOTE — ED Notes (Signed)
Pt placed on bed pan to attempt for urine sample

## 2017-03-22 NOTE — ED Notes (Signed)
Report given to ICU RN

## 2017-03-22 NOTE — Consult Note (Signed)
Reason for Consult: abscess Referring Physician: K Ward  Kathryn Dillon is an 56 y.o. female.  HPI: 56 yo female with COPD, insulin dependent diabetes mellitus presents with 3 days of perianal pain and fevers. This is her 7th abscess in her life. She denies diarrhea or vomiting. She last used crack cocaine 4 days ago. She does not inject illicit substances. She has not had a bowel movement in 3 days.  Past Medical History:  Diagnosis Date  . COPD (chronic obstructive pulmonary disease) with chronic bronchitis ([place])   . Diabetes mellitus without complication ([place])   . H/O drug abuse   . H/O ETOH abuse   . High cholesterol   . Hypertension   . PAD (peripheral artery disease) ([place])     History reviewed. No pertinent surgical history.  No family history on file.  Social History:  reports that she has been smoking Cigarettes.  She has never used smokeless tobacco. She reports that she does not drink alcohol or use drugs.  Allergies:  Allergies  Allergen Reactions  . Lisinopril Cough    Medications: I have reviewed the patient's current medications.  Results for orders placed or performed during the hospital encounter of 03/21/17 (from the past 48 hour(s))  Troponin I     Status: None   Collection Time: 03/22/17 12:30 AM  Result Value Ref Range   Troponin I <0.03 <0.03 ng/mL  Basic metabolic panel     Status: Abnormal   Collection Time: 03/22/17 12:31 AM  Result Value Ref Range   Sodium 133 (L) 135 - 145 mmol/L   Potassium 3.0 (L) 3.5 - 5.1 mmol/L   Chloride 99 (L) 101 - 111 mmol/L   CO2 21 (L) 22 - 32 mmol/L   Glucose, Bld 252 (H) 65 - 99 mg/dL   BUN 21 (H) 6 - 20 mg/dL   Creatinine, Ser 1.44 (H) 0.44 - 1.00 mg/dL   Calcium 8.7 (L) 8.9 - 10.3 mg/dL   GFR calc non Af Amer 40 (L) >60 mL/min   GFR calc Af Amer 46 (L) >60 mL/min    Comment: (NOTE) The eGFR has been calculated using the CKD EPI equation. This calculation has not been validated in all clinical  situations. eGFR's persistently <60 mL/min signify possible Chronic Kidney Disease.    Anion gap 13 5 - 15  CBC     Status: Abnormal   Collection Time: 03/22/17 12:31 AM  Result Value Ref Range   WBC 15.8 (H) 4.0 - 10.5 K/uL   RBC 3.35 (L) 3.87 - 5.11 MIL/uL   Hemoglobin 9.4 (L) 12.0 - 15.0 g/dL   HCT 28.2 (L) 36.0 - 46.0 %   MCV 84.2 78.0 - 100.0 fL   MCH 28.1 26.0 - 34.0 pg   MCHC 33.3 30.0 - 36.0 g/dL   RDW 13.4 11.5 - 15.5 %   Platelets 244 150 - 400 K/uL  I-Stat CG4 Lactic Acid, ED     Status: None   Collection Time: 03/22/17  1:48 AM  Result Value Ref Range   Lactic Acid, Venous 1.61 0.5 - 1.9 mmol/L  I-Stat CG4 Lactic Acid, ED  (not at  [facility])     Status: None   Collection Time: 03/22/17  5:22 AM  Result Value Ref Range   Lactic Acid, Venous 1.14 0.5 - 1.9 mmol/L    Dg Chest 2 View  Result Date: 03/22/2017 CLINICAL DATA:  Chest pain EXAM: CHEST  2 VIEW COMPARISON:  None. FINDINGS:  The heart size and mediastinal contours are within normal limits. Both lungs are clear. The visualized skeletal structures are unremarkable. IMPRESSION: No active cardiopulmonary disease. Electronically Signed   By: Ulyses Jarred M.D.   On: 03/22/2017 00:19   Ct Pelvis W Contrast  Result Date: 03/22/2017 CLINICAL DATA:  Abscess along the right side of the buttocks. White cell count 15.8. History of hypertension and diabetes. EXAM: CT PELVIS WITH CONTRAST TECHNIQUE: Multidetector CT imaging of the pelvis was performed using the standard protocol following the bolus administration of intravenous contrast. CONTRAST:  80 mL Isovue-300 COMPARISON:  None. FINDINGS: Urinary Tract: Bladder wall is not thickened and no filling defects are identified. Bowel: Visualized portions of the colon and small bowel are normal in caliber without inflammatory change. Vascular/Lymphatic: Calcification in the iliac arteries. Reproductive:  Uterus and ovaries are not enlarged. Other: Infiltration throughout the peroneal fat  in the right ischiorectal fossa and extending superiorly to the level of the inferior coccyx. Soft tissue infiltration and gas collection extend to the midline at the gluteal crease. Largest collection measures about 4.4 x 8.7 cm. Changes are consistent with abscess and cellulitis caused by gas-forming organism. Musculoskeletal: Mild degenerative changes in the hips. Partial sacralization of L5. No destructive bone lesions. IMPRESSION: Large gas containing abscess in the right ischiorectal fossa with surrounding cellulitis. Electronically Signed   By: Lucienne Capers M.D.   On: 03/22/2017 04:12    Review of Systems  Constitutional: Positive for chills, fever and malaise/fatigue.  HENT: Negative for hearing loss.   Eyes: Negative for blurred vision and double vision.  Respiratory: Negative for cough and hemoptysis.   Cardiovascular: Negative for chest pain and palpitations.  Gastrointestinal: Negative for abdominal pain, nausea and vomiting.  Genitourinary: Negative for dysuria and urgency.  Musculoskeletal: Positive for back pain. Negative for myalgias and neck pain.  Skin: Negative for itching and rash.  Neurological: Negative for dizziness, tingling and headaches.  Endo/Heme/Allergies: Does not bruise/bleed easily.  Psychiatric/Behavioral: Positive for substance abuse. Negative for depression and suicidal ideas.   Blood pressure (!) 81/45, pulse (!) 102, temperature 100.1 F (37.8 C), temperature source Oral, resp. rate 14, SpO2 100 %. Physical Exam  Vitals reviewed. Constitutional: She is oriented to person, place, and time. She appears well-developed and well-nourished.  HENT:  Head: Normocephalic and atraumatic.  Eyes: Pupils are equal, round, and reactive to light. Conjunctivae and EOM are normal.  Neck: Normal range of motion. Neck supple.  Cardiovascular: Normal rate and regular rhythm.   Respiratory: Effort normal and breath sounds normal.  GI: Soft. Bowel sounds are normal. She  exhibits no distension. There is no tenderness.  Genitourinary:     Musculoskeletal: Normal range of motion.  Neurological: She is alert and oriented to person, place, and time.  Skin: Skin is warm and dry.  Psychiatric: She has a normal mood and affect. Her behavior is normal.      Assessment/Plan: 56 yo female with diabetes and illicit substance abuse presence with perirectal abscess. -IV abx -conscious sedation and incision and drainage -admit to medicine service for diabetes management during acute infection -sitz baths tomorrow -encourage to stop using illicit substances  Arta Bruce Kinsinger 03/22/2017, 7:24 AM

## 2017-03-22 NOTE — Progress Notes (Signed)
RT was present for Conscious Sedation. No meds were given due to low BP. MD, Ward released RT from bedside.

## 2017-03-22 NOTE — ED Provider Notes (Addendum)
[facility]-EMERGENCY DEPT Provider Note   CSN: [id number] Arrival date & time: 03/21/17  2331     History   Chief Complaint Chief Complaint  Patient presents with  . Chest Pain  . Abscess    HPI Kathryn Dillon is a 56 y.o. female.  Patient presents with complaint of chest pain for the past 3 days. She reports the pain is constant. Worse with cough. No SOB. She feels nausea intermittently but denies any vomiting. No known fever. She reports history of CAD with stents 8 years ago, lost to cardiology follow up. She continues to smoke. She has NTG at home but did not think to take any for this pain. She has a history of alcohol and drug abuse but denies recent use.   She also complains of a boil on her right buttock "for a while". No drainage. She has had similar symptoms previously. She feels it may have started about a month ago.    The history is provided by the patient. No language interpreter was used.  Chest Pain   Associated symptoms include cough and nausea. Pertinent negatives include no abdominal pain, no fever, no shortness of breath and no vomiting.  Abscess  Associated symptoms: nausea   Associated symptoms: no fever and no vomiting     Past Medical History:  Diagnosis Date  . COPD (chronic obstructive pulmonary disease) with chronic bronchitis (HCC)   . Diabetes mellitus without complication (HCC)   . H/O drug abuse   . H/O ETOH abuse   . High cholesterol   . Hypertension   . PAD (peripheral artery disease) [facility])     Patient Active Problem List   Diagnosis Date Noted  . Shoulder injury, left, subsequent encounter 08/03/2016  . Onychocryptosis 03/30/2016  . Atherosclerosis of native artery of both lower extremities with intermittent claudication (HCC) 03/23/2016  . Cigarette smoker 01/13/2016  . Obesity (BMI 30-39.9) 11/05/2015  . Substance abuse in remission 11/05/2015  . Chronic obstructive pulmonary disease (HCC) 04/27/2015  . Diabetes mellitus (HCC)  08/02/2013  . Essential hypertension 08/02/2013  . Hyperlipidemia 08/02/2013  . Coronary artery disease 08/02/2013    History reviewed. No pertinent surgical history.  OB History    No data available       Home Medications    Prior to Admission medications   Medication Sig Start Date End Date Taking? Authorizing Provider  insulin NPH-regular Human (HUMULIN 70/30) (70-30) 100 UNIT/ML injection Inject 80 Units into the skin 2 (two) times daily with a meal.   Yes [provider]  diclofenac (VOLTAREN) 75 MG EC tablet Take 1 tablet (75 mg total) by mouth 2 (two) times daily. Patient not taking: Reported on 03/22/2017 08/02/16   Lenda Kelp, MD  HYDROcodone-acetaminophen (NORCO/VICODIN) 5-325 MG tablet Take 1 tablet by mouth every 6 (six) hours as needed for severe pain. Patient not taking: Reported on 03/22/2017 08/22/16   Ward, Chase Picket, PA-C  meloxicam (MOBIC) 15 MG tablet Take 1 tablet (15 mg total) by mouth daily. Patient not taking: Reported on 03/22/2017 10/24/16   Lenda Kelp, MD  OXYGEN Inhale into the lungs as needed. 2L [place] PRN    [provider]    Family History No family history on file.  Social History Social History  Substance Use Topics  . Smoking status: Current Every Day Smoker    Types: Cigarettes  . Smokeless tobacco: Never Used  . Alcohol use No     Allergies   Lisinopril  Review of Systems Review of Systems  Constitutional: Negative for chills and fever.  HENT: Negative.  Negative for congestion and sore throat.   Respiratory: Positive for cough. Negative for shortness of breath.   Cardiovascular: Positive for chest pain.  Gastrointestinal: Positive for nausea. Negative for abdominal pain and vomiting.  Genitourinary: Negative.   Musculoskeletal: Negative.   Skin:       See HPI.  Neurological: Negative.      Physical Exam Updated Vital Signs BP (!) 144/64 (BP Location: Right Arm)   Pulse (!) 114   Temp 97.8  F (36.6 C) (Oral)   Resp 18   SpO2 97%   Physical Exam  Constitutional: She is oriented to person, place, and time. She appears well-developed and well-nourished. No distress.  HENT:  Head: Normocephalic.  Neck: Normal range of motion. Neck supple.  Cardiovascular: Normal rate and regular rhythm.   No murmur heard. Pulmonary/Chest: Effort normal and breath sounds normal. She has no wheezes. She has no rales. She exhibits tenderness.  Abdominal: Soft. Bowel sounds are normal. There is no tenderness. There is no rebound and no guarding.  Musculoskeletal: Normal range of motion. She exhibits no edema.  Neurological: She is alert and oriented to person, place, and time.  Skin: Skin is warm and dry. No rash noted.  There is a large area of tender induration along the medial right buttock extending from below the pilonidal area to perineum. No pointing areas or active drainage. Area is warm to touch.  Psychiatric: She has a normal mood and affect.     ED Treatments / Results  Labs (all labs ordered are listed, but only abnormal results are displayed) Labs Reviewed  BASIC METABOLIC PANEL - Abnormal; Notable for the following:       Result Value   Sodium 133 (*)    Potassium 3.0 (*)    Chloride 99 (*)    CO2 21 (*)    Glucose, Bld 252 (*)    BUN 21 (*)    Creatinine, Ser 1.44 (*)    Calcium 8.7 (*)    GFR calc non Af Amer 40 (*)    GFR calc Af Amer 46 (*)    All other components within normal limits  CBC - Abnormal; Notable for the following:    WBC 15.8 (*)    RBC 3.35 (*)    Hemoglobin 9.4 (*)    HCT 28.2 (*)    All other components within normal limits  TROPONIN I    EKG  EKG Interpretation None       Radiology Dg Chest 2 View  Result Date: 03/22/2017 CLINICAL DATA:  Chest pain EXAM: CHEST  2 VIEW COMPARISON:  None. FINDINGS: The heart size and mediastinal contours are within normal limits. Both lungs are clear. The visualized skeletal structures are  unremarkable. IMPRESSION: No active cardiopulmonary disease. Electronically Signed   By: Deatra Robinson M.D.   On: 03/22/2017 00:19   Dg Chest 2 View  Result Date: 03/22/2017 CLINICAL DATA:  Chest pain EXAM: CHEST  2 VIEW COMPARISON:  None. FINDINGS: The heart size and mediastinal contours are within normal limits. Both lungs are clear. The visualized skeletal structures are unremarkable. IMPRESSION: No active cardiopulmonary disease. Electronically Signed   By: Deatra Robinson M.D.   On: 03/22/2017 00:19   Ct Pelvis W Contrast  Result Date: 03/22/2017 CLINICAL DATA:  Abscess along the right side of the buttocks. White cell count 15.8. History of hypertension and diabetes.  EXAM: CT PELVIS WITH CONTRAST TECHNIQUE: Multidetector CT imaging of the pelvis was performed using the standard protocol following the bolus administration of intravenous contrast. CONTRAST:  80 mL Isovue-300 COMPARISON:  None. FINDINGS: Urinary Tract: Bladder wall is not thickened and no filling defects are identified. Bowel: Visualized portions of the colon and small bowel are normal in caliber without inflammatory change. Vascular/Lymphatic: Calcification in the iliac arteries. Reproductive:  Uterus and ovaries are not enlarged. Other: Infiltration throughout the peroneal fat in the right ischiorectal fossa and extending superiorly to the level of the inferior coccyx. Soft tissue infiltration and gas collection extend to the midline at the gluteal crease. Largest collection measures about 4.4 x 8.7 cm. Changes are consistent with abscess and cellulitis caused by gas-forming organism. Musculoskeletal: Mild degenerative changes in the hips. Partial sacralization of L5. No destructive bone lesions. IMPRESSION: Large gas containing abscess in the right ischiorectal fossa with surrounding cellulitis. Electronically Signed   By: Burman Nieves M.D.   On: 03/22/2017 04:12    Procedures Procedures (including critical care  time)  Medications Ordered in ED Medications  sodium chloride 0.9 % bolus 500 mL (not administered)     Initial Impression / Assessment and Plan / ED Course  I have reviewed the triage vital signs and the nursing notes.  Pertinent labs & imaging results that were available during my care of the patient were reviewed by me and considered in my medical decision making (see chart for details).     Patient presents with chest pain. Pain pattern is not typical for ischemia but she has a past history of Stenting. EKG has no ischemic changes. Troponin pending. CXR clear.   Large area of induration and tenderness to right buttock concerning for tracking abscess. The patient is febrile to 102, tachycardic, with leukocytosis of 15.5 and a history of DM.  Sepsis work up initiated with contrasted CT pelvis to evaluate for abscess. Lactic acid resulted as 1.61 - negative.  The patient is comfortable. Resting quietly.  Scan delayed due to census in the department. When completed and resulted it is showing "large gas containing abscess right ischiorectal fossa with surrounding cellulitis." Results discussed with Dr. Gerrit Friends who advised surgery will see and admit.   Work up for cardiac origin of chest pain is negative, do not suspect ischemic component to chest pain.   The patient is seen and evaluated by Dr. Elesa Massed.   7:30 - Dr. Sheliah Hatch is here to see the patient and requests assistance with conscious sedation so he can drain the abscess at bedside. He requests medicine to admit for inpatient care.   Discussed with [facility] who accepts for admission. Additional fluids ordered per [facility] recommendation.   Re-check of the patient finds her standing at bedside urinating on the floor. NAD. Steady. No lightheadedness.   Final Clinical Impressions(s) / ED Diagnoses   Final diagnoses:  None   1. Surgical Abscess right buttock, gas containing 2. Nonspecific chest pain 3. Febrile illness  New  Prescriptions New Prescriptions   No medications on file     Elpidio Anis, Cordelia Poche 03/22/17 1308    Ward, Layla Maw, DO 03/22/17 0640    Elpidio Anis, PA-C 03/22/17 0739    Ward, Layla Maw, DO 03/22/17 [phone]

## 2017-03-22 NOTE — Op Note (Signed)
Preoperative diagnosis: perirectal abscess  Postoperative diagnosis: same   Procedure: incision and drainage of perirectal abscess  Surgeon: Feliciana Rossetti, M.D.  Asst: none  Anesthesia: local  Indications for procedure: Kathryn Dillon is a 55 y.o. year old female with symptoms of perianal pain for 3 days.  Description of procedure: The patient was in the ER. Appropriate monitoring devices were put in place. Due to hypotension, decision was made not to administer IV sedation medications. WHO checklist was applied. The patient was then placed in right lateral decubitus position. The area was prepped and draped in the usual sterile fashion.  Next, 15ml 0 of 2% lidocaine was infused over the area. After anesthesia was tested and found intact, a cruciate incision was made in the area of highest fluctuance. A large amount of brown malodorous fluid and some initial rush of air was expressed and sent for culture. The area was probed and additional drainage produced. After palpation was used to confirm all areas were drained. A moistened kerlex was packed into the area and dry gauze and tape put in place for dressing  Findings: large amount of brown malodorous fluid and a rush of air  Specimen: abscess of perirectal area  Implant: kerlex   Blood loss: 30ml  Local anesthesia: 15ml 2% lidocaine  Complications: none  Feliciana Rossetti, M.D. General, Bariatric, & Minimally Invasive Surgery [facility]

## 2017-03-23 DIAGNOSIS — A419 Sepsis, unspecified organism: Principal | ICD-10-CM

## 2017-03-23 DIAGNOSIS — I10 Essential (primary) hypertension: Secondary | ICD-10-CM

## 2017-03-23 DIAGNOSIS — L0231 Cutaneous abscess of buttock: Secondary | ICD-10-CM

## 2017-03-23 DIAGNOSIS — E0800 Diabetes mellitus due to underlying condition with hyperosmolarity without nonketotic hyperglycemic-hyperosmolar coma (NKHHC): Secondary | ICD-10-CM

## 2017-03-23 DIAGNOSIS — J439 Emphysema, unspecified: Secondary | ICD-10-CM

## 2017-03-23 LAB — COMPREHENSIVE METABOLIC PANEL
ALT: 26 U/L (ref 14–54)
AST: 31 U/L (ref 15–41)
Albumin: 2.4 g/dL — ABNORMAL LOW (ref 3.5–5.0)
Alkaline Phosphatase: 105 U/L (ref 38–126)
Anion gap: 10 (ref 5–15)
BUN: 20 mg/dL (ref 6–20)
CO2: 21 mmol/L — ABNORMAL LOW (ref 22–32)
Calcium: 8.3 mg/dL — ABNORMAL LOW (ref 8.9–10.3)
Chloride: 104 mmol/L (ref 101–111)
Creatinine, Ser: 1.28 mg/dL — ABNORMAL HIGH (ref 0.44–1.00)
GFR calc Af Amer: 54 mL/min — ABNORMAL LOW (ref >60)
GFR calc non Af Amer: 46 mL/min — ABNORMAL LOW (ref >60)
Glucose, Bld: 358 mg/dL — ABNORMAL HIGH (ref 65–99)
Potassium: 4.1 mmol/L (ref 3.5–5.1)
Sodium: 135 mmol/L (ref 135–145)
Total Bilirubin: 0.4 mg/dL (ref 0.3–1.2)
Total Protein: 6.3 g/dL — ABNORMAL LOW (ref 6.5–8.1)

## 2017-03-23 LAB — GLUCOSE, CAPILLARY
Glucose-Capillary: 139 mg/dL — ABNORMAL HIGH (ref 65–99)
Glucose-Capillary: 267 mg/dL — ABNORMAL HIGH (ref 65–99)
Glucose-Capillary: 323 mg/dL — ABNORMAL HIGH (ref 65–99)
Glucose-Capillary: 335 mg/dL — ABNORMAL HIGH (ref 65–99)
Glucose-Capillary: 356 mg/dL — ABNORMAL HIGH (ref 65–99)
Glucose-Capillary: 392 mg/dL — ABNORMAL HIGH (ref 65–99)
Glucose-Capillary: 417 mg/dL — ABNORMAL HIGH (ref 65–99)

## 2017-03-23 LAB — HIV ANTIBODY (ROUTINE TESTING W REFLEX): HIV Screen 4th Generation wRfx: NONREACTIVE

## 2017-03-23 LAB — CBC
HCT: 26 % — ABNORMAL LOW (ref 36.0–46.0)
Hemoglobin: 8.4 g/dL — ABNORMAL LOW (ref 12.0–15.0)
MCH: 27.2 pg (ref 26.0–34.0)
MCHC: 32.3 g/dL (ref 30.0–36.0)
MCV: 84.1 fL (ref 78.0–100.0)
Platelets: 279 10*3/uL (ref 150–400)
RBC: 3.09 MIL/uL — ABNORMAL LOW (ref 3.87–5.11)
RDW: 14.1 % (ref 11.5–15.5)
WBC: 14.1 10*3/uL — ABNORMAL HIGH (ref 4.0–10.5)

## 2017-03-23 LAB — HEPATITIS PANEL, ACUTE
HCV Ab: <0.1 s/co ratio (ref 0.0–0.9)
Hep A IgM: NEGATIVE
Hep B C IgM: NEGATIVE
Hepatitis B Surface Ag: NEGATIVE

## 2017-03-23 LAB — TROPONIN I
Troponin I: <0.03 ng/mL (ref <0.03)
Troponin I: <0.03 ng/mL (ref <0.03)
Troponin I: <0.03 ng/mL (ref <0.03)

## 2017-03-23 MED ORDER — ASPIRIN EC 81 MG PO TBEC
81.0000 mg | DELAYED_RELEASE_TABLET | Freq: Every day | ORAL | Status: DC
  Administered 2017-03-23 – 2017-03-26 (×4): 81 mg via ORAL
  Filled 2017-03-23 (×4): qty 1

## 2017-03-23 MED ORDER — INSULIN ASPART 100 UNIT/ML ~~LOC~~ SOLN
3.0000 [IU] | Freq: Once | SUBCUTANEOUS | Status: AC
  Administered 2017-03-23: 3 [IU] via SUBCUTANEOUS

## 2017-03-23 MED ORDER — INSULIN ASPART PROT & ASPART (70-30 MIX) 100 UNIT/ML ~~LOC~~ SUSP
60.0000 [IU] | Freq: Two times a day (BID) | SUBCUTANEOUS | Status: DC
  Administered 2017-03-23 – 2017-03-26 (×6): 60 [IU] via SUBCUTANEOUS

## 2017-03-23 MED ORDER — INSULIN ASPART 100 UNIT/ML ~~LOC~~ SOLN
10.0000 [IU] | Freq: Once | SUBCUTANEOUS | Status: AC
  Administered 2017-03-23: 10 [IU] via SUBCUTANEOUS

## 2017-03-23 NOTE — Progress Notes (Signed)
CC:  Sepsis/perirectal abscess  Subjective: She seems to be doing better, site is still very painful.  Objective: Vital signs in last 24 hours: Temp:  [98.4 F (36.9 C)-101.1 F (38.4 C)] 98.4 F (36.9 C) (09/20 0526) Pulse Rate:  [89-115] 89 (09/20 0526) Resp:  [16-24] 16 (09/20 0526) BP: (106-138)/(64-91) 126/76 (09/20 0526) SpO2:  [96 %-100 %] 99 % (09/20 0526) Weight:  [94 kg (207 lb 3.7 oz)-100.8 kg (222 lb 3.6 oz)] 100.8 kg (222 lb 3.6 oz) (09/19 2333) Last BM Date: 03/20/17 By mouth not recorded 5600 IV Urine 450 recorded` MAXIMUM TEMPERATURE 102 ED admission 9/19 at 2300. Afebrile yesterday vital signs are stable. Sodiums improved to 135  glucoses ranging from 252-422 WBC down to 14.1 Hemoglobin 8.4/hematocrit 26 - stable VQ scan negative for PE CT scan of the pelvis 03/22/17:  Large gas containing abscess in the right ischiorectal fossa with surrounding cellulitis   Intake/Output from previous day: 09/19 0701 - 09/20 0700 In: 5572.9 [I.V.:3622.9; IV Piggyback:1950] Out: 450 [Urine:450] Intake/Output this shift: No intake/output data recorded.  General appearance: alert, cooperative and no distress Incision/Wound:  Still has some necrotic tissue and purulence at the base.   Lab Results:   Recent Labs  03/22/17 0031 03/23/17 0444  WBC 15.8* 14.1*  HGB 9.4* 8.4*  HCT 28.2* 26.0*  PLT 244 279    BMET  Recent Labs  03/22/17 0031 03/23/17 0444  NA 133* 135  K 3.0* 4.1  CL 99* 104  CO2 21* 21*  GLUCOSE 252* 358*  BUN 21* 20  CREATININE 1.44* 1.28*  CALCIUM 8.7* 8.3*   PT/INR No results for input(s): LABPROT, INR in the last 72 hours.   Recent Labs Lab 03/23/17 0444  AST 31  ALT 26  ALKPHOS 105  BILITOT 0.4  PROT 6.3*  ALBUMIN 2.4*     Lipase  No results found for: LIPASE   Medications: . enoxaparin (LOVENOX) injection  40 mg Subcutaneous Q24H  . insulin aspart  0-9 Units Subcutaneous TID WC  . insulin aspart protamine-  aspart  40 Units Subcutaneous BID WC  . lidocaine (PF)  30 mL Subcutaneous Once  . propofol  0.5 mg/kg Intravenous Once   . 0.9 % NaCl with KCl 40 mEq / L 125 mL/hr (03/22/17 0901)  . piperacillin-tazobactam (ZOSYN)  IV 3.375 g (03/23/17 0839)  . vancomycin Stopped (03/23/17 0300)   Anti-infectives    Start     Dose/Rate Route Frequency Ordered Stop   03/22/17 1400  vancomycin (VANCOCIN) IVPB 750 mg/150 ml premix     750 mg 150 mL/hr over 60 Minutes Intravenous Every 12 hours 03/22/17 0813     03/22/17 1000  piperacillin-tazobactam (ZOSYN) IVPB 3.375 g     3.375 g 12.5 mL/hr over 240 Minutes Intravenous Every 8 hours 03/22/17 0813     03/22/17 0500  clindamycin (CLEOCIN) IVPB 900 mg     900 mg 100 mL/hr over 30 Minutes Intravenous  Once 03/22/17 0455 03/22/17 0615   03/22/17 0130  vancomycin (VANCOCIN) IVPB 1000 mg/200 mL premix     1,000 mg 200 mL/hr over 60 Minutes Intravenous  Once 03/22/17 0119 03/22/17 0334   03/22/17 0130  piperacillin-tazobactam (ZOSYN) IVPB 3.375 g     3.375 g 100 mL/hr over 30 Minutes Intravenous  Once 03/22/17 0119 03/22/17 0230      Assessment/Plan Perirectal abscess S/p Incision and drainage of perirectal abscess, 03/22/17, Dr. Feliciana Rossetti. Sepsis Atypical chest pain Drug screen positive  for cocaine and opiates. Acute renal insufficiency COPD Diabetes mellitus- poor control - hemoglobin A1c 9.7 Hypertension Hyperlipidemia Obesity FEN: IV fluids/carbohydrate modified ID: Zosyn/vancomycin 03/22/17=>> day 2 DVT:  SCDs added   Plan:  Wet to dry dressings, hydro Rx, showers and sitz baths.      LOS: 1 day    Chrysta Fulcher 03/23/2017 [phone]

## 2017-03-23 NOTE — Progress Notes (Signed)
PROGRESS NOTE  Kathryn Dillon ZOX:[id number] DOB: 02/07/1961 DOA: 03/21/2017 PCP: Jackie Plum, MD   LOS: 1 day   Brief Narrative / Interim history: 56 year old female with diabetes, cocaine use, hypertension, hyperlipidemia, COPD, coronary artery disease, PAD, was admitted to the hospital on 9/19 due to primary complaints of buttock pain.  She is also complaining of chest pain.  General surgery was consulted and she was found to have a right buttock abscess, and she is status post I&D in the emergency room.  Assessment & Plan: Principal Problem:   Sepsis (HCC) Active Problems:   Chronic obstructive pulmonary disease (HCC)   Diabetes mellitus (HCC)   Essential hypertension   Hyperlipidemia   Obesity (BMI 30-39.9)   Coronary artery disease   Sepsis due to cellulitis/buttock abscess -Bedside incision and drainage done by general surgery on 9/19, cultures were sent and are pending this morning.  She was started on broad-spectrum antibiotics with vancomycin and Zosyn, continue for now until cultures result  Chest pain with reported CAD -Patient states that she has had cardiac catheterization in the past with stent placement, however this was not done here.  She underwent a VQ scan which was negative for PE.  Cardiac enzymes have remained negative.  She underwent a 2D echo on admission which showed an ejection fraction of 60-65%, no wall motion abnormalities. -atypical quality, continue to monitor.  Chest pain is now resolved. -Continue aspirin  COPD -No wheezing, this appears stable, nebulizers as needed  Hypertension -Hold antihypertensives medications in the setting of sepsis  Diabetes mellitus -Poorly controlled with a hemoglobin A1c of 9.7, at home she is on 70/30, 80 units twice daily, was placed on 40 units twice daily here and her CBGs today on the 300s range. -Increase her 70/30 to 60 units twice daily.  Continue sliding scale.  Peripheral arterial disease -She is  followed by vascular surgery at [facility].  No evidence of limb ischemia currently  Acute kidney injury -Likely in the setting of sepsis, prior creatinine has been normal, monitor   DVT prophylaxis: Lovenox Code Status: Full code Family Communication: No family at bedside Disposition Plan: Home when ready  Consultants:   General surgery  Procedures:   Incision and drainage 9/19 by general surgery  Antimicrobials:  Vancomycin / Zosyn 9/19 >>   Subjective: - no chest pain, shortness of breath, no abdominal pain, nausea or vomiting.   Objective: Vitals:   03/22/17 2112 03/22/17 2200 03/22/17 2333 03/23/17 0526  BP:  114/75 111/67 126/76  Pulse: (!) 104 96 93 89  Resp: (!) 23 (!) 23 (!) 21 16  Temp: 99.4 F (37.4 C)  98.7 F (37.1 C) 98.4 F (36.9 C)  TempSrc: Oral  Oral Oral  SpO2: 97% 97% 99% 99%  Weight:   100.8 kg (222 lb 3.6 oz)   Height:    (1.626 m)     Intake/Output Summary (Last 24 hours) at 03/23/17 1218 Last data filed at 03/23/17 0600  Gross per 24 hour  Intake          3072.92 ml  Output              450 ml  Net          2622.92 ml   Filed Weights   03/22/17 1000 03/22/17 2333  Weight: 94 kg (207 lb 3.7 oz) 100.8 kg (222 lb 3.6 oz)    Examination:  Constitutional: NAD Eyes: lids and conjunctivae normal Neck: normal, supple, no masses,  no thyromegaly Respiratory: clear to auscultation bilaterally, no wheezing, no crackles. Normal respiratory effort.  Cardiovascular: Regular rate and rhythm, no murmurs / rubs / gallops. No LE edema. 2+ pedal pulses.  Abdomen: no tenderness. Bowel sounds positive.  Musculoskeletal: no clubbing / cyanosis.  Skin: no rashes, lesions, ulcers. No induration. Right buttock drained abscess with tenderness to palpation, packing in place Psychiatric: Normal judgment and insight   Data Reviewed: I have independently reviewed following labs and imaging studies  CBC:  Recent Labs Lab 03/22/17 0031  03/23/17 0444  WBC 15.8* 14.1*  HGB 9.4* 8.4*  HCT 28.2* 26.0*  MCV 84.2 84.1  PLT 244 279   Basic Metabolic Panel:  Recent Labs Lab 03/22/17 0031 03/22/17 1219 03/23/17 0444  NA 133*  --  135  K 3.0*  --  4.1  CL 99*  --  104  CO2 21*  --  21*  GLUCOSE 252*  --  358*  BUN 21*  --  20  CREATININE 1.44*  --  1.28*  CALCIUM 8.7*  --  8.3*  MG  --  2.0  --    GFR: Estimated Creatinine Clearance: 57.3 mL/min (A) (by C-G formula based on SCr of 1.28 mg/dL (H)). Liver Function Tests:  Recent Labs Lab 03/23/17 0444  AST 31  ALT 26  ALKPHOS 105  BILITOT 0.4  PROT 6.3*  ALBUMIN 2.4*   No results for input(s): LIPASE, AMYLASE in the last 168 hours. No results for input(s): AMMONIA in the last 168 hours. Coagulation Profile: No results for input(s): INR, PROTIME in the last 168 hours. Cardiac Enzymes:  Recent Labs Lab 03/22/17 0030 03/23/17 1033  TROPONINI <0.03 <0.03   BNP (last 3 results) No results for input(s): PROBNP in the last 8760 hours. HbA1C:  Recent Labs  03/22/17 1219  HGBA1C 9.7*   CBG:  Recent Labs Lab 03/22/17 2117 03/22/17 2335 03/23/17 0059 03/23/17 0525 03/23/17 0739  GLUCAP 422* 392* 417* 335* 356*   Lipid Profile:  Recent Labs  03/22/17 1219  CHOL 124  HDL 15*  LDLCALC 60  TRIG 161*  CHOLHDL 8.3   Thyroid Function Tests: No results for input(s): TSH, T4TOTAL, FREET4, T3FREE, THYROIDAB in the last 72 hours. Anemia Panel: No results for input(s): VITAMINB12, FOLATE, FERRITIN, TIBC, IRON, RETICCTPCT in the last 72 hours. Urine analysis:    Component Value Date/Time   COLORURINE YELLOW 03/22/2017 1500   APPEARANCEUR HAZY (A) 03/22/2017 1500   LABSPEC 1.039 (H) 03/22/2017 1500   PHURINE 5.0 03/22/2017 1500   GLUCOSEU 150 (A) 03/22/2017 1500   HGBUR LARGE (A) 03/22/2017 1500   BILIRUBINUR NEGATIVE 03/22/2017 1500   KETONESUR NEGATIVE 03/22/2017 1500   PROTEINUR 100 (A) 03/22/2017 1500   NITRITE NEGATIVE 03/22/2017  1500   LEUKOCYTESUR NEGATIVE 03/22/2017 1500   Sepsis Labs: Invalid input(s): PROCALCITONIN, LACTICIDVEN  Recent Results (from the past 240 hour(s))  Aerobic Culture (superficial specimen)     Status: None (Preliminary result)   Collection Time: 03/22/17  9:46 AM  Result Value Ref Range Status   Specimen Description ABSCESS RIGHT BUTTOCKS  Final   Special Requests NONE  Final   Gram Stain   Final    MODERATE WBC PRESENT,BOTH PMN AND MONONUCLEAR ABUNDANT GRAM POSITIVE COCCI IN PAIRS ABUNDANT GRAM VARIABLE ROD MODERATE GRAM POSITIVE COCCI IN CHAINS    Culture PENDING  Incomplete   Report Status PENDING  Incomplete  MRSA PCR Screening     Status: None   Collection Time: 03/22/17  3:00 PM  Result Value Ref Range Status   MRSA by PCR NEGATIVE NEGATIVE Final    Comment:        The GeneXpert MRSA Assay (FDA approved for NASAL specimens only), is one component of a comprehensive MRSA colonization surveillance program. It is not intended to diagnose MRSA infection nor to guide or monitor treatment for MRSA infections.       Radiology Studies: Dg Chest 2 View  Result Date: 03/22/2017 CLINICAL DATA:  Chest pain EXAM: CHEST  2 VIEW COMPARISON:  None. FINDINGS: The heart size and mediastinal contours are within normal limits. Both lungs are clear. The visualized skeletal structures are unremarkable. IMPRESSION: No active cardiopulmonary disease. Electronically Signed   By: Deatra Robinson M.D.   On: 03/22/2017 00:19   Ct Pelvis W Contrast  Result Date: 03/22/2017 CLINICAL DATA:  Abscess along the right side of the buttocks. White cell count 15.8. History of hypertension and diabetes. EXAM: CT PELVIS WITH CONTRAST TECHNIQUE: Multidetector CT imaging of the pelvis was performed using the standard protocol following the bolus administration of intravenous contrast. CONTRAST:  80 mL Isovue-300 COMPARISON:  None. FINDINGS: Urinary Tract: Bladder wall is not thickened and no filling defects  are identified. Bowel: Visualized portions of the colon and small bowel are normal in caliber without inflammatory change. Vascular/Lymphatic: Calcification in the iliac arteries. Reproductive:  Uterus and ovaries are not enlarged. Other: Infiltration throughout the peroneal fat in the right ischiorectal fossa and extending superiorly to the level of the inferior coccyx. Soft tissue infiltration and gas collection extend to the midline at the gluteal crease. Largest collection measures about 4.4 x 8.7 cm. Changes are consistent with abscess and cellulitis caused by gas-forming organism. Musculoskeletal: Mild degenerative changes in the hips. Partial sacralization of L5. No destructive bone lesions. IMPRESSION: Large gas containing abscess in the right ischiorectal fossa with surrounding cellulitis. Electronically Signed   By: Burman Nieves M.D.   On: 03/22/2017 04:12   Nm Pulmonary Perf And Vent  Result Date: 03/22/2017 CLINICAL DATA:  High pre high pretest probability for acute pulmonary embolism. EXAM: NUCLEAR MEDICINE VENTILATION - PERFUSION LUNG SCAN TECHNIQUE: Ventilation images were obtained in multiple projections using inhaled aerosol Tc-62m DTPA. Perfusion images were obtained in multiple projections after intravenous injection of Tc-8m MAA. RADIOPHARMACEUTICALS:  32.5 mCi Technetium-66m DTPA aerosol inhalation and 4.1 mCi Technetium-76m MAA IV COMPARISON:  Chest radiograph 03/22/2014 FINDINGS: Ventilation: No focal ventilation defect. Perfusion: No wedge shaped peripheral perfusion defects to suggest acute pulmonary embolism. IMPRESSION: No evidence acute pulmonary embolism. Electronically Signed   By: Genevive Bi M.D.   On: 03/22/2017 12:04     Scheduled Meds: . enoxaparin (LOVENOX) injection  40 mg Subcutaneous Q24H  . insulin aspart  0-9 Units Subcutaneous TID WC  . insulin aspart protamine- aspart  40 Units Subcutaneous BID WC  . lidocaine (PF)  30 mL Subcutaneous Once  . propofol   0.5 mg/kg Intravenous Once   Continuous Infusions: . 0.9 % NaCl with KCl 40 mEq / L 125 mL/hr (03/23/17 1102)  . piperacillin-tazobactam (ZOSYN)  IV Stopped (03/23/17 1239)  . vancomycin Stopped (03/23/17 0300)     Pamella Pert, MD, PhD [facility] Pager [phone]  If 7PM-7AM, please contact night-coverage [email or web address] Password [email or web address] 03/23/2017, 12:18 PM

## 2017-03-23 NOTE — Progress Notes (Signed)
   03/23/17 1440 Hydrotherapy Evaluation [id number]  Subjective Assessment  Subjective If it hurts you are going to stop  Patient and Family Stated Goals agreed to wound healing  Date of Onset (present on admission with debridement at [facility])  Evaluation and Treatment  Evaluation and Treatment Procedures Explained to Patient/Family Yes  Evaluation and Treatment Procedures agreed to  Wound / Incision (Open or Dehisced) 03/23/17 Diabetic ulcer Rectum Right malodorous wound on right perrectal area.  Date First Assessed/Time First Assessed: 03/23/17 1217   Wound Type: Diabetic ulcer  Location: Rectum  Location Orientation: Right  Wound Description (Comments): malodorous wound on right perrectal area.  Present on Admission: Yes  Dressing Type Moist to dry;Paper tape  Dressing Changed New  Dressing Status Clean  Dressing Change Frequency PRN  Site / Wound Assessment Painful;Pale;Yellow;Brown  % Wound base Red or Granulating 0%  % Wound base Black/Eschar 100%  Peri-wound Assessment Intact  Wound Length (cm) 4 cm  Wound Width (cm) 2 cm  Wound Depth (cm) 2 cm  Wound Volume (cm^3) 16 cm^3  Wound Surface Area (cm^2) 8 cm^2  Undermining (cm) 4 (at 7:00)  Margins Unattached edges (unapproximated)  Drainage Amount Moderate  Drainage Description Purulent;Odor  Non-staged Wound Description Partial thickness  Treatment Debridement (Selective);Hydrotherapy (Pulse lavage);Packing (Saline gauze)  Hydrotherapy  Pulsed lavage therapy - wound location right perianal  Pulsed Lavage with Suction (psi) 8 psi  Pulsed Lavage with Suction - Normal Saline Used 1000 mL  Pulsed Lavage Tip Tip with splash shield  Selective Debridement  Selective Debridement - Location perirectal  Selective Debridement - Tools Used Forceps;Scissors  Selective Debridement - Tissue Removed dark, odorous slough  Wound Therapy - Assess/Plan/Recommendations  Wound Therapy - Clinical Statement The  wound has dark slough, with odor, in  wound bed. There is a flap of viable tissue on the right margin of the wound that needs to be held back for diverter tip to be introduced into wound in order to provide  more effective PLS to the wound. The patient tolerated well with pain medication. PLS will be beneficial for wound healing .  Wound Therapy - Functional Problem List is mobile with assistance  Factors Delaying/Impairing Wound Healing Diabetes Mellitus  Hydrotherapy Plan Debridement;Dressing change;Pulsatile lavage with suction  Wound Therapy - Frequency 6X / week  Wound Therapy - Current Recommendations PT  Wound Therapy - Follow Up Recommendations Home health RN  Wound Plan PLS and dressing changes  Wound Therapy Goals - Improve the function of patient's integumentary system by progressing the wound(s) through the phases of wound healing by:  Decrease Necrotic Tissue to 50  Decrease Necrotic Tissue - Progress Goal set today  Increase Granulation Tissue to 50  Increase Granulation Tissue - Progress Goal set today  Improve Drainage Characteristics Min  Improve Drainage Characteristics - Progress Goal set today  Patient/Family will be able to  reposition self from buttocks  Patient/Family Instruction Goal - Progress Goal set today  Goals/treatment plan/discharge plan were made with and agreed upon by patient/family Yes  Time For Goal Achievement 2 weeks  Wound Therapy - Potential for Goals Truitt Leep PT [phone]

## 2017-03-23 NOTE — Progress Notes (Signed)
Pt had not preference for HH. [facility] was selected, referral given to in house rep.

## 2017-03-23 NOTE — Progress Notes (Signed)
Inpatient Diabetes Program Recommendations  AACE/ADA: New Consensus Statement on Inpatient Glycemic Control (2015)  Target Ranges:  Prepandial:   less than 140 mg/dL      Peak postprandial:   less than 180 mg/dL (1-2 hours)      Critically ill patients:  140 - 180 mg/dL   Lab Results  Component Value Date   GLUCAP 323 (H) 03/23/2017   HGBA1C 9.7 (H) 03/22/2017    Review of Glycemic Control  Diabetes history: DM2 Outpatient Diabetes medications: 70/30 80 units bid Current orders for Inpatient glycemic control: 70/30 60 units bid, Novolog 0-9 units tidwc  HgbA1C - 9.7%. Needs tighter control. Needs to f/u with PCP  Inpatient Diabetes Program Recommendations:    Increase 70/30 to 70 units bid Add HS correction  Will follow. Thank you. Ailene Ards, RD, LDN, CDE Inpatient Diabetes Coordinator [phone]

## 2017-03-24 LAB — AEROBIC CULTURE  (SUPERFICIAL SPECIMEN)

## 2017-03-24 LAB — AEROBIC CULTURE W GRAM STAIN (SUPERFICIAL SPECIMEN)

## 2017-03-24 LAB — GLUCOSE, CAPILLARY
Glucose-Capillary: 108 mg/dL — ABNORMAL HIGH (ref 65–99)
Glucose-Capillary: 114 mg/dL — ABNORMAL HIGH (ref 65–99)
Glucose-Capillary: 181 mg/dL — ABNORMAL HIGH (ref 65–99)
Glucose-Capillary: 116 mg/dL — ABNORMAL HIGH (ref 65–99)

## 2017-03-24 MED ORDER — OXYCODONE-ACETAMINOPHEN 5-325 MG PO TABS
1.0000 | ORAL_TABLET | ORAL | Status: DC | PRN
  Administered 2017-03-24 – 2017-03-26 (×4): 2 via ORAL
  Filled 2017-03-24 (×4): qty 2

## 2017-03-24 MED ORDER — DEXTROSE 5 % IV SOLN
2.0000 g | INTRAVENOUS | Status: DC
  Administered 2017-03-24 – 2017-03-25 (×2): 2 g via INTRAVENOUS
  Filled 2017-03-24 (×3): qty 2

## 2017-03-24 MED ORDER — SACCHAROMYCES BOULARDII 250 MG PO CAPS
250.0000 mg | ORAL_CAPSULE | Freq: Two times a day (BID) | ORAL | Status: DC
  Administered 2017-03-24 – 2017-03-26 (×5): 250 mg via ORAL
  Filled 2017-03-24 (×5): qty 1

## 2017-03-24 MED ORDER — MORPHINE SULFATE (PF) 4 MG/ML IV SOLN
2.0000 mg | INTRAVENOUS | Status: DC | PRN
  Administered 2017-03-24 – 2017-03-26 (×7): 2 mg via INTRAVENOUS
  Filled 2017-03-24 (×7): qty 1

## 2017-03-24 NOTE — Progress Notes (Signed)
Pharmacy Antibiotic Note  Kathryn Dillon is a 56 y.o. female admitted on 03/21/2017 with sepsis.  Pharmacy has been consulted for vanc/Zosyn dosing.  Day #3 antibiotics - abscess cx with Strep anginosus - SCr improved yesterday but no labs today - no fevers   Plan:  Vancomycin  IV q12h  Zosyn 3.375gm IV q8h over 4h infusion  Based on abscess culture, suggest de-escalation of antibiotics (consider ceftriaxone while final susc are pending)  Height:  (162.6 cm) Weight: 222 lb 3.6 oz (100.8 kg) IBW/kg (Calculated) : 54.7  Temp (24hrs), Avg:98.8 F (37.1 C), Min:98.2 F (36.8 C), Max:99.4 F (37.4 C)   Recent Labs Lab 03/22/17 0031 03/22/17 0148 03/22/17 0522 03/23/17 0444  WBC 15.8*  --   --  14.1*  CREATININE 1.44*  --   --  1.28*  LATICACIDVEN  --  1.61 1.14  --     Estimated Creatinine Clearance: 57.3 mL/min (A) (by C-G formula based on SCr of 1.28 mg/dL (H)).    Allergies  Allergen Reactions  . Lisinopril Cough   Antimicrobials this admission: 9/19 vanc >> 9/19 Zosyn >>  Dose adjustments this admission:  Microbiology results: 9/19 BCx x2:  9/19 MRSA PCR: neg 9/19 right buttock abscess: abundant strep anginosis  Thank you for allowing pharmacy to be a part of this patient's care.   Juliette Alcide, PharmD, BCPS.   Pager: [phone] 03/24/2017 10:32 AM

## 2017-03-24 NOTE — Progress Notes (Signed)
PROGRESS NOTE  Kathryn Dillon ZOX:[id number] DOB: 1961/02/13 DOA: 03/21/2017 PCP: Jackie Plum, MD   LOS: 2 days   Brief Narrative / Interim history: 56 year old female with diabetes, cocaine use, hypertension, hyperlipidemia, COPD, coronary artery disease, PAD, was admitted to the hospital on 9/19 due to primary complaints of buttock pain.  She is also complaining of chest pain.  General surgery was consulted and she was found to have a right buttock abscess, and she is status post I&D in the emergency room.  Assessment & Plan: Principal Problem:   Sepsis (HCC) Active Problems:   Chronic obstructive pulmonary disease (HCC)   Diabetes mellitus (HCC)   Essential hypertension   Hyperlipidemia   Obesity (BMI 30-39.9)   Coronary artery disease   Sepsis due to cellulitis/buttock abscess -Bedside incision and drainage done by general surgery on 9/19, cultures were sent and are pending this morning.  She was started on broad-spectrum antibiotics with vancomycin and Zosyn.  Cultures speciated Streptococcus anginosus, narrow to ceftriaxone  Chest pain with reported CAD -Patient states that she has had cardiac catheterization in the past with stent placement, however this was not done here.  She underwent a VQ scan which was negative for PE.  Cardiac enzymes have remained negative.  She underwent a 2D echo on admission which showed an ejection fraction of 60-65%, no wall motion abnormalities. -atypical quality, unlikely cardiac.  Chest pain is now resolved. -Continue aspirin  COPD -No wheezing, this appears stable, nebulizers as needed  Hypertension -Hold antihypertensives medications, blood pressure has remained stable  Diabetes mellitus -Poorly controlled with a hemoglobin A1c of 9.7, at home she is on 70/30, 80 units twice daily, was placed on 40 units twice daily here and her CBGs today on the 300s range. -Increase her 70/30 to 60 units twice daily on 9/20, fasting CBG this  morning 108 and prelunch 114, continue current regimen  Peripheral arterial disease -She is followed by vascular surgery at [facility].  No evidence of limb ischemia currently  Acute kidney injury -Likely in the setting of sepsis, prior creatinine has been normal, monitor   DVT prophylaxis: Lovenox Code Status: Full code Family Communication: No family at bedside Disposition Plan: Home when ready  Consultants:   General surgery  Procedures:   Incision and drainage 9/19 by general surgery  Antimicrobials:  Vancomycin / Zosyn 9/19 >> 9/21  Ceftriaxone 9/21 >>  Subjective: -Feeling well this morning, less buttocks pain, no fevers or chills overnight  Objective: Vitals:   03/23/17 0526 03/23/17 1445 03/23/17 2100 03/24/17 0537  BP: 126/76 (!) 123/96 134/68   Pulse: 89 90 90 (!) 107  Resp: Temp: 98.4 F (36.9 C) 98.2 F (36.8 C) 99.4 F (37.4 C)   TempSrc: Oral Oral Oral Oral  SpO2: 99% 100% 100% 100%  Weight:      Height:        Intake/Output Summary (Last 24 hours) at 03/24/17 1429 Last data filed at 03/24/17 1400  Gross per 24 hour  Intake             3660 ml  Output             1403 ml  Net             2257 ml   Filed Weights   03/22/17 1000 03/22/17 2333  Weight: 94 kg (207 lb 3.7 oz) 100.8 kg (222 lb 3.6 oz)    Examination:  Constitutional: NAD, calm, comfortable Eyes:  PERRL, lids and conjunctivae normal ENMT: Mucous membranes are moist.  Respiratory: clear to auscultation bilaterally, no wheezing, no crackles. Normal respiratory effort.  Cardiovascular: Regular rate and rhythm, no murmurs / rubs / gallops. No LE edema. 2+ pedal pulses.  Abdomen: no tenderness. Bowel sounds positive.  Skin: Right buttock abscess about half an inch diameter, less tender to palpation Neurologic: non focal    Data Reviewed: I have independently reviewed following labs and imaging studies  CBC:  Recent Labs Lab 03/22/17 0031 03/23/17 0444  WBC  15.8* 14.1*  HGB 9.4* 8.4*  HCT 28.2* 26.0*  MCV 84.2 84.1  PLT 244 279   Basic Metabolic Panel:  Recent Labs Lab 03/22/17 0031 03/22/17 1219 03/23/17 0444  NA 133*  --  135  K 3.0*  --  4.1  CL 99*  --  104  CO2 21*  --  21*  GLUCOSE 252*  --  358*  BUN 21*  --  20  CREATININE 1.44*  --  1.28*  CALCIUM 8.7*  --  8.3*  MG  --  2.0  --    GFR: Estimated Creatinine Clearance: 57.3 mL/min (A) (by C-G formula based on SCr of 1.28 mg/dL (H)). Liver Function Tests:  Recent Labs Lab 03/23/17 0444  AST 31  ALT 26  ALKPHOS 105  BILITOT 0.4  PROT 6.3*  ALBUMIN 2.4*   No results for input(s): LIPASE, AMYLASE in the last 168 hours. No results for input(s): AMMONIA in the last 168 hours. Coagulation Profile: No results for input(s): INR, PROTIME in the last 168 hours. Cardiac Enzymes:  Recent Labs Lab 03/22/17 0030 03/23/17 1033 03/23/17 1427 03/23/17 1956  TROPONINI <0.03 <0.03 <0.03 <0.03   BNP (last 3 results) No results for input(s): PROBNP in the last 8760 hours. HbA1C:  Recent Labs  03/22/17 1219  HGBA1C 9.7*   CBG:  Recent Labs Lab 03/23/17 1151 03/23/17 1738 03/23/17 2147 03/24/17 0801 03/24/17 1121  GLUCAP 323* 267* 139* 108* 114*   Lipid Profile:  Recent Labs  03/22/17 1219  CHOL 124  HDL 15*  LDLCALC 60  TRIG 161*  CHOLHDL 8.3   Thyroid Function Tests: No results for input(s): TSH, T4TOTAL, FREET4, T3FREE, THYROIDAB in the last 72 hours. Anemia Panel: No results for input(s): VITAMINB12, FOLATE, FERRITIN, TIBC, IRON, RETICCTPCT in the last 72 hours. Urine analysis:    Component Value Date/Time   COLORURINE YELLOW 03/22/2017 1500   APPEARANCEUR HAZY (A) 03/22/2017 1500   LABSPEC 1.039 (H) 03/22/2017 1500   PHURINE 5.0 03/22/2017 1500   GLUCOSEU 150 (A) 03/22/2017 1500   HGBUR LARGE (A) 03/22/2017 1500   BILIRUBINUR NEGATIVE 03/22/2017 1500   KETONESUR NEGATIVE 03/22/2017 1500   PROTEINUR 100 (A) 03/22/2017 1500   NITRITE  NEGATIVE 03/22/2017 1500   LEUKOCYTESUR NEGATIVE 03/22/2017 1500   Sepsis Labs: Invalid input(s): PROCALCITONIN, LACTICIDVEN  Recent Results (from the past 240 hour(s))  Blood culture (routine x 2)     Status: None (Preliminary result)   Collection Time: 03/22/17  1:36 AM  Result Value Ref Range Status   Specimen Description BLOOD RIGHT ANTECUBITAL  Final   Special Requests   Final    BOTTLES DRAWN AEROBIC AND ANAEROBIC Blood Culture adequate volume   Culture   Final    NO GROWTH 1 DAY Performed at [facility], [address], [place], [place] [address]    Report Status PENDING  Incomplete  Blood culture (routine x 2)     Status:  None (Preliminary result)   Collection Time: 03/22/17  5:09 AM  Result Value Ref Range Status   Specimen Description BLOOD RIGHT ANTECUBITAL  Final   Special Requests   Final    BOTTLES DRAWN AEROBIC AND ANAEROBIC Blood Culture adequate volume   Culture   Final    NO GROWTH 1 DAY Performed at [facility], [address], [place], [place] [address]    Report Status PENDING  Incomplete  Aerobic Culture (superficial specimen)     Status: None   Collection Time: 03/22/17  9:46 AM  Result Value Ref Range Status   Specimen Description ABSCESS RIGHT BUTTOCKS  Final   Special Requests NONE  Final   Gram Stain   Final    MODERATE WBC PRESENT,BOTH PMN AND MONONUCLEAR ABUNDANT GRAM POSITIVE COCCI IN PAIRS ABUNDANT GRAM VARIABLE ROD MODERATE GRAM POSITIVE COCCI IN CHAINS    Culture ABUNDANT STREPTOCOCCUS ANGINOSIS  Final   Report Status 03/24/2017 FINAL  Final   Organism ID, Bacteria STREPTOCOCCUS ANGINOSIS  Final      Susceptibility   Streptococcus anginosis - MIC*    PENICILLIN <=0.06 SENSITIVE Sensitive     CEFTRIAXONE 0.25 SENSITIVE Sensitive     ERYTHROMYCIN >=8 RESISTANT Resistant     LEVOFLOXACIN 1 SENSITIVE Sensitive     VANCOMYCIN 0.5 SENSITIVE Sensitive     * ABUNDANT STREPTOCOCCUS ANGINOSIS  MRSA PCR Screening     Status: None    Collection Time: 03/22/17  3:00 PM  Result Value Ref Range Status   MRSA by PCR NEGATIVE NEGATIVE Final    Comment:        The GeneXpert MRSA Assay (FDA approved for NASAL specimens only), is one component of a comprehensive MRSA colonization surveillance program. It is not intended to diagnose MRSA infection nor to guide or monitor treatment for MRSA infections.       Radiology Studies: No results found.   Scheduled Meds: . aspirin EC  81 mg Oral Daily  . enoxaparin (LOVENOX) injection  40 mg Subcutaneous Q24H  . insulin aspart  0-9 Units Subcutaneous TID WC  . insulin aspart protamine- aspart  60 Units Subcutaneous BID WC  . lidocaine (PF)  30 mL Subcutaneous Once  . propofol  0.5 mg/kg Intravenous Once  . saccharomyces boulardii  250 mg Oral BID   Continuous Infusions: . 0.9 % NaCl with KCl 40 mEq / L 125 mL/hr (03/24/17 0952)  . cefTRIAXone (ROCEPHIN)  IV       Pamella Pert, MD, PhD [facility] Pager [phone]  If 7PM-7AM, please contact night-coverage [email or web address] Password [email or web address] 03/24/2017, 2:29 PM

## 2017-03-24 NOTE — Progress Notes (Signed)
CC: Sepsis/perirectal abscess  Subjective: She is doing better, they just finished her hydrotherapy.  I told her we could do this while she is here and then just sitz baths and showers at home.  We can discontinue the packing today.  She can just clean the site and redress with a clean dressing at home.    Objective: Vital signs in last 24 hours: Temp:  [98.2 F (36.8 C)-99.4 F (37.4 C)] 99.4 F (37.4 C) (09/20 2100) Pulse Rate:  [90-107] 107 (09/21 0537) Resp:  [17-18] 18 (09/21 0537) BP: (123-134)/(68-96) 134/68 (09/20 2100) SpO2:  [100 %] 100 % (09/21 0537) Last BM Date: 03/21/17 From perirectal wound ABUNDANT STREPTOCOCCUS ANGINOSIS  SUSCEPTIBILITIES TO FOLLOW      600 Po 3450 IV 803 urine recorded Afebrile, VSS No labs - glucose is down to the 100's    Intake/Output from previous day: 09/20 0701 - 09/21 0700 In: 4050 [P.O.:600; I.V.:3000; IV Piggyback:450] Out: 803 [Urine:803] Intake/Output this shift: No intake/output data recorded.  General appearance: alert, cooperative and no distress Skin: Skin color, texture, turgor normal. No rashes or lesions or still some slough at the base but clean.   Lab Results:   Recent Labs  03/22/17 0031 03/23/17 0444  WBC 15.8* 14.1*  HGB 9.4* 8.4*  HCT 28.2* 26.0*  PLT 244 279    BMET  Recent Labs  03/22/17 0031 03/23/17 0444  NA 133* 135  K 3.0* 4.1  CL 99* 104  CO2 21* 21*  GLUCOSE 252* 358*  BUN 21* 20  CREATININE 1.44* 1.28*  CALCIUM 8.7* 8.3*   PT/INR No results for input(s): LABPROT, INR in the last 72 hours.   Recent Labs Lab 03/23/17 0444  AST 31  ALT 26  ALKPHOS 105  BILITOT 0.4  PROT 6.3*  ALBUMIN 2.4*     Lipase  No results found for: LIPASE   Medications: . aspirin EC  81 mg Oral Daily  . enoxaparin (LOVENOX) injection  40 mg Subcutaneous Q24H  . insulin aspart  0-9 Units Subcutaneous TID WC  . insulin aspart protamine- aspart  60 Units Subcutaneous BID WC  . lidocaine  (PF)  30 mL Subcutaneous Once  . propofol  0.5 mg/kg Intravenous Once   . 0.9 % NaCl with KCl 40 mEq / L 125 mL/hr (03/23/17 1102)  . piperacillin-tazobactam (ZOSYN)  IV 3.375 g (03/24/17 0926)  . vancomycin Stopped (03/24/17 0342)   Anti-infectives    Start     Dose/Rate Route Frequency Ordered Stop   03/22/17 1400  vancomycin (VANCOCIN) IVPB 750 mg/150 ml premix     750 mg 150 mL/hr over 60 Minutes Intravenous Every 12 hours 03/22/17 0813     03/22/17 1000  piperacillin-tazobactam (ZOSYN) IVPB 3.375 g     3.375 g 12.5 mL/hr over 240 Minutes Intravenous Every 8 hours 03/22/17 0813     03/22/17 0500  clindamycin (CLEOCIN) IVPB 900 mg     900 mg 100 mL/hr over 30 Minutes Intravenous  Once 03/22/17 0455 03/22/17 0615   03/22/17 0130  vancomycin (VANCOCIN) IVPB 1000 mg/200 mL premix     1,000 mg 200 mL/hr over 60 Minutes Intravenous  Once 03/22/17 0119 03/22/17 0334   03/22/17 0130  piperacillin-tazobactam (ZOSYN) IVPB 3.375 g     3.375 g 100 mL/hr over 30 Minutes Intravenous  Once 03/22/17 0119 03/22/17 0230     Assessment/Plan  Perirectal abscess S/p Incision and drainage of perirectal abscess, 03/22/17, Dr. Feliciana Rossetti. Sepsis  Atypical chest pain Drug screen positive for cocaine and opiates. Acute renal insufficiency COPD Diabetes mellitus- poor control - hemoglobin A1c 9.7 Hypertension Hyperlipidemia Obesity FEN: IV fluids/carbohydrate modified ID: Zosyn/vancomycin 03/22/17=>> day 3 DVT:  SCDs added   Plan:  Clean dry dressing after today.  I will add a probiotic and told her she could do this at home till off the antibiotics.   Follow up in our office in 3 weeks.  I have placed post op instructions and follow up in the AVS.    LOS: 2 days    Leone Putman 03/24/2017 [phone]

## 2017-03-24 NOTE — Progress Notes (Signed)
Spoke with patient about her diabetes. States that she was diagnosed about 20 years ago.  Takes 70/30 insulin 80 units BID at home. Dr. Allena Katz is her physician that regulates her diabetes.  States that she checks blood sugars up to 4 times per day. She was pleased to hear that her HgbA1C was 9.7% because it has been much higher in the past. No problems financially getting her medications for diabetes. Did not have any questions.   Smith Mince RN BSN CDE Diabetes Coordinator Pager: [phone]  8am-5pm

## 2017-03-24 NOTE — Progress Notes (Signed)
   03/24/17 1000  Subjective Assessment  Subjective i'm ok  Patient and Family Stated Goals agreed to wound healing  Prior Treatments I and D  Evaluation and Treatment  Evaluation and Treatment Procedures Explained to Patient/Family Yes  Evaluation and Treatment Procedures agreed to  Wound / Incision (Open or Dehisced) 03/23/17 Diabetic ulcer Rectum Right malodorous wound on right perrectal area.  Date First Assessed/Time First Assessed: 03/23/17 1217   Wound Type: Diabetic ulcer  Location: Rectum  Location Orientation: Right  Wound Description (Comments): malodorous wound on right perrectal area.  Present on Admission: Yes  Dressing Type Moist to dry  Dressing Change Frequency PRN  Site / Wound Assessment Painful;Pink;Yellow;Dusky  % Wound base Red or Granulating 0%  % Wound base Yellow/Fibrinous Exudate 100%  Peri-wound Assessment Intact  Wound Length (cm) (see eval)  Closure None  Drainage Amount Moderate (dressing removed by MD)  Drainage Description Purulent  Non-staged Wound Description Not applicable  Treatment Debridement (Selective);Cleansed;Hydrotherapy (Pulse lavage);Packing (Saline gauze)  Hydrotherapy  Pulsed lavage therapy - wound location right perirectal  Pulsed Lavage with Suction (psi) 8 psi  Pulsed Lavage with Suction - Normal Saline Used 1000 mL  Pulsed Lavage Tip Tip with splash shield  Selective Debridement  Selective Debridement - Location perirectal  Selective Debridement - Tools Used Forceps;Scissors  Selective Debridement - Tissue Removed dark, odorous slough  Wound Therapy - Assess/Plan/Recommendations  Wound Therapy - Clinical Statement The  wound with 100% slough, with odor, in wound bed. there is a flap of vable tissue on the right margin of the wound that needs to be held back for diverter tip to be introduced into wound in order to provide  more PLS to the wound. The patient tolerated well with pain medication. PLS will be beneficial for wound healing  .  Wound Therapy - Functional Problem List is mobile with assistance  Factors Delaying/Impairing Wound Healing Diabetes Mellitus  Hydrotherapy Plan Debridement;Dressing change;Pulsatile lavage with suction  Wound Therapy - Frequency 6X / week  Wound Therapy - Follow Up Recommendations Home health RN  Wound Plan PLS and dressing changes  Wound Therapy Goals - Improve the function of patient's integumentary system by progressing the wound(s) through the phases of wound healing by:  Decrease Necrotic Tissue to 50  Decrease Necrotic Tissue - Progress Progressing toward goal  Increase Granulation Tissue to 50  Increase Granulation Tissue - Progress Progressing toward goal  Patient/Family will be able to  reposition self from buttocks  Patient/Family Instruction Goal - Progress Progressing toward goal  Goals/treatment plan/discharge plan were made with and agreed upon by patient/family Yes  Time For Goal Achievement 2 weeks  Wound Therapy - Potential for Goals Good

## 2017-03-24 NOTE — Progress Notes (Signed)
At 4pm today, patient had BM that consisted of two large, formed, hard pieces of stool. She then showered with the assistance of the RN. RN helped to cleanse buttocks and wound. After showering, a dry dressing was placed over the clean wound per the Surgery PA's note that packing no longer had to be used. At about 6pm, RN was notified by NT that patient had went to the bathroom to urinate and she was found to have stool all over her buttocks. The NT was cleaning the patient's buttocks and noted that there was stool coming out of the wound, though the dressing was still in place. RN came to the room to assess and the wound was full of soft, mushy stool. RN cleansed the wound with normal saline irrigant and gauze to remove as much stool as possible. Patient was in a large amount of pain, 10/10 during the cleansing. RN placed wet packing in the wound to help soak up any residual stool. RN notified Hospitalist of events as the patient needed extra pain medication at that time and he questioned if RN felt that the patient may have a fistula. RN reported to MD that it was suspicious for a fistula as wound was clean and patient had not passed any other BM's from her rectum since the one at 4pm. RN passed off to night RN to continue to assess the wound for further amounts of stool and to try and encourage the patient to have a sitz bath to better cleanse the wound.

## 2017-03-25 LAB — BASIC METABOLIC PANEL
Anion gap: 7 (ref 5–15)
BUN: 14 mg/dL (ref 6–20)
CO2: 23 mmol/L (ref 22–32)
Calcium: 8.3 mg/dL — ABNORMAL LOW (ref 8.9–10.3)
Chloride: 107 mmol/L (ref 101–111)
Creatinine, Ser: 1.11 mg/dL — ABNORMAL HIGH (ref 0.44–1.00)
GFR calc Af Amer: >60 mL/min (ref >60)
GFR calc non Af Amer: 55 mL/min — ABNORMAL LOW (ref >60)
Glucose, Bld: 116 mg/dL — ABNORMAL HIGH (ref 65–99)
Potassium: 4.5 mmol/L (ref 3.5–5.1)
Sodium: 137 mmol/L (ref 135–145)

## 2017-03-25 LAB — GLUCOSE, CAPILLARY
Glucose-Capillary: 187 mg/dL — ABNORMAL HIGH (ref 65–99)
Glucose-Capillary: 158 mg/dL — ABNORMAL HIGH (ref 65–99)
Glucose-Capillary: 125 mg/dL — ABNORMAL HIGH (ref 65–99)
Glucose-Capillary: 88 mg/dL (ref 65–99)

## 2017-03-25 LAB — CBC
HCT: 23.3 % — ABNORMAL LOW (ref 36.0–46.0)
Hemoglobin: 8 g/dL — ABNORMAL LOW (ref 12.0–15.0)
MCH: 28.7 pg (ref 26.0–34.0)
MCHC: 34.3 g/dL (ref 30.0–36.0)
MCV: 83.5 fL (ref 78.0–100.0)
Platelets: 365 10*3/uL (ref 150–400)
RBC: 2.79 MIL/uL — ABNORMAL LOW (ref 3.87–5.11)
RDW: 13.8 % (ref 11.5–15.5)
WBC: 13.1 10*3/uL — ABNORMAL HIGH (ref 4.0–10.5)

## 2017-03-25 NOTE — Progress Notes (Signed)
[facility] Office:  [phone] General Surgery Progress Note   LOS: 3 days  POD -     Chief Complaint: Rectal pain  Assessment and Plan: 1.  S/p Incision and drainage of perirectal abscess, 03/22/17, Dr. Franky Macho Kinsinger  WBC - 13,100 - 03/25/2017  Rocephin  Some concerns about stool in wound - and she is at risk for fistula in ano - but would do no further diagnostic test at this time for this.  Needs local wound care (sitz baths 3 times a day) and oral antibiotics.  Follow up wit Dr. Sheliah Hatch in about 2 weeks.  2.  Drug screen positive for cocaine and opiates. 3.  Acute renal insufficiency 4.  COPD 5.  Diabetes mellitus- poor control - hemoglobin A1c 9.7 6.  Anemia  Hgb - 8.3 - 03/25/2017 7.  On chronic disability  Says she's on home O2 for her COPD 8. DVT prophylaxis - Lovenox   Principal Problem:   Sepsis (HCC) Active Problems:   Chronic obstructive pulmonary disease (HCC)   Diabetes mellitus (HCC)   Essential hypertension   Hyperlipidemia   Obesity (BMI 30-39.9)   Coronary artery disease  Subjective:  Sore, but getting better.  Eating without difficulty.  She lives with her mother.  Is on disability.  Objective:   Vitals:   03/24/17 2109 03/25/17 0516  BP: 125/66 (!) 114/57  Pulse: 77 89  Resp: 18 18  Temp: 98.1 F (36.7 C) 98.8 F (37.1 C)  SpO2: 98% 98%     Intake/Output from previous day:  09/21 0701 - 09/22 0700 In: 3720 [P.O.:620; I.V.:3000; IV Piggyback:100] Out: 2050 [Urine:2050]  Intake/Output this shift:  Total I/O In: 717.1 [P.O.:240; I.V.:477.1] Out: 750 [Urine:750]   Physical Exam:   General: Obese AA F who is alert and oriented.    HEENT: Normal. Pupils equal. .   Lungs: Clear   Abdomen: Soft   Wound: Right buttocks wound is generally clean.   Lab Results:    Recent Labs  03/23/17 0444 03/25/17 0419  WBC 14.1* 13.1*  HGB 8.4* 8.0*  HCT 26.0* 23.3*  PLT 279 365    BMET   Recent Labs  03/23/17 0444  03/25/17 0419  NA 135 137  K 4.1 4.5  CL 104 107  CO2 21* 23  GLUCOSE 358* 116*  BUN 20 14  CREATININE 1.28* 1.11*  CALCIUM 8.3* 8.3*    PT/INR  No results for input(s): LABPROT, INR in the last 72 hours.  ABG  No results for input(s): PHART, HCO3 in the last 72 hours.  Invalid input(s): PCO2, PO2   Studies/Results:  No results found.   Anti-infectives:   Anti-infectives    Start     Dose/Rate Route Frequency Ordered Stop   03/24/17 1600  cefTRIAXone (ROCEPHIN) 2 g in dextrose 5 % 50 mL IVPB     2 g 100 mL/hr over 30 Minutes Intravenous Every 24 hours 03/24/17 1126     03/22/17 1400  vancomycin (VANCOCIN) IVPB 750 mg/150 ml premix  Status:  Discontinued     750 mg 150 mL/hr over 60 Minutes Intravenous Every 12 hours 03/22/17 0813 03/24/17 1126   03/22/17 1000  piperacillin-tazobactam (ZOSYN) IVPB 3.375 g  Status:  Discontinued     3.375 g 12.5 mL/hr over 240 Minutes Intravenous Every 8 hours 03/22/17 0813 03/24/17 1126   03/22/17 0500  clindamycin (CLEOCIN) IVPB 900 mg     900 mg 100 mL/hr over 30 Minutes Intravenous  Once  03/22/17 0455 03/22/17 0615   03/22/17 0130  vancomycin (VANCOCIN) IVPB 1000 mg/200 mL premix     1,000 mg 200 mL/hr over 60 Minutes Intravenous  Once 03/22/17 0119 03/22/17 0334   03/22/17 0130  piperacillin-tazobactam (ZOSYN) IVPB 3.375 g     3.375 g 100 mL/hr over 30 Minutes Intravenous  Once 03/22/17 0119 03/22/17 0230      Ovidio Kin, MD, FACS Pager: [phone] [facility] Office: [phone] 03/25/2017

## 2017-03-25 NOTE — Progress Notes (Signed)
PROGRESS NOTE  Kathryn Dillon WUJ:[id number] DOB: 12/10/1960 DOA: 03/21/2017 PCP: Jackie Plum, MD   LOS: 3 days   Brief Narrative / Interim history: 56 year old female with diabetes, cocaine use, hypertension, hyperlipidemia, COPD, coronary artery disease, PAD, was admitted to the hospital on 9/19 due to primary complaints of buttock pain.  She is also complaining of chest pain.  General surgery was consulted and she was found to have a right buttock abscess, and she is status post I&D in the emergency room.  Assessment & Plan: Principal Problem:   Sepsis (HCC) Active Problems:   Chronic obstructive pulmonary disease (HCC)   Diabetes mellitus (HCC)   Essential hypertension   Hyperlipidemia   Obesity (BMI 30-39.9)   Coronary artery disease   Sepsis due to cellulitis/buttock abscess -Bedside incision and drainage done by general surgery on 9/19, cultures were sent and are pending this morning.  She was started on broad-spectrum antibiotics with vancomycin and Zosyn.  Cultures speciated Streptococcus anginosus, narrow to ceftriaxone  ?  Rectocutaneous fistula -Concern for fistulous communication between the wound/abscess the GI tract as fecal material was noticed to be in the wound and wound care RN noticed a new tract was formed -Obtain CT scan, if positive will discuss with surgery  Chest pain with reported CAD -Patient states that she has had cardiac catheterization in the past with stent placement, however this was not done here.  She underwent a VQ scan which was negative for PE.  Cardiac enzymes have remained negative.  She underwent a 2D echo on admission which showed an ejection fraction of 60-65%, no wall motion abnormalities. -atypical quality, unlikely cardiac.  Chest pain is now resolved. -Continue aspirin  COPD -No wheezing, this appears stable, nebulizers as needed  Hypertension -Hold antihypertensives medications, blood pressure has remained stable  Diabetes  mellitus -Poorly controlled with a hemoglobin A1c of 9.7, at home she is on 70/30, 80 units twice daily, was placed on 40 units twice daily here and her CBGs today on the 300s range. -Increased her 70/30 to 60 units twice daily on 9/20, fasting CBG this morning 116, continue current regimen  Peripheral arterial disease -She is followed by vascular surgery at [facility].  No evidence of limb ischemia currently  Acute kidney injury -Likely in the setting of sepsis, prior creatinine has been normal, monitor, this is now improved.   DVT prophylaxis: Lovenox Code Status: Full code Family Communication: No family at bedside Disposition Plan: Home when ready  Consultants:   General surgery  Procedures:   Incision and drainage 9/19 by general surgery  Antimicrobials:  Vancomycin / Zosyn 9/19 >> 9/21  Ceftriaxone 9/21 >>  Subjective: -Feels like her buttock pain has increased, is more tender.  No chest pain, no shortness of breath, no fever or chills  Objective: Vitals:   03/24/17 0537 03/24/17 1458 03/24/17 2109 03/25/17 0516  BP:  (!) 127/58 125/66 (!) 114/57  Pulse: (!) 107 99 77 89  Resp: Temp:  98.4 F (36.9 C) 98.1 F (36.7 C) 98.8 F (37.1 C)  TempSrc: Oral Oral Oral Oral  SpO2: 100% 100% 98% 98%  Weight:      Height:        Intake/Output Summary (Last 24 hours) at 03/25/17 1151 Last data filed at 03/25/17 0949  Gross per 24 hour  Intake          4147.08 ml  Output  2200 ml  Net          1947.08 ml   Filed Weights   03/22/17 1000 03/22/17 2333  Weight: 94 kg (207 lb 3.7 oz) 100.8 kg (222 lb 3.6 oz)    Examination:  Constitutional: NAD, calm, comfortable Eyes: PERRL, lids and conjunctivae normal Respiratory: clear to auscultation bilaterally, no wheezing, no crackles. Normal respiratory effort.  Cardiovascular: Regular rate and rhythm, no murmurs / rubs / gallops. No LE edema.  Abdomen: no tenderness. Bowel sounds positive.    Skin: right buttock wound without drainage,  Neurologic: non focal     Data Reviewed: I have independently reviewed following labs and imaging studies  CBC:  Recent Labs Lab 03/22/17 0031 03/23/17 0444 03/25/17 0419  WBC 15.8* 14.1* 13.1*  HGB 9.4* 8.4* 8.0*  HCT 28.2* 26.0* 23.3*  MCV 84.2 84.1 83.5  PLT 244 279 365   Basic Metabolic Panel:  Recent Labs Lab 03/22/17 0031 03/22/17 1219 03/23/17 0444 03/25/17 0419  NA 133*  --  135 137  K 3.0*  --  4.1 4.5  CL 99*  --  104 107  CO2 21*  --  21* 23  GLUCOSE 252*  --  358* 116*  BUN 21*  --  20 14  CREATININE 1.44*  --  1.28* 1.11*  CALCIUM 8.7*  --  8.3* 8.3*  MG  --  2.0  --   --    GFR: Estimated Creatinine Clearance: 66.1 mL/min (A) (by C-G formula based on SCr of 1.11 mg/dL (H)). Liver Function Tests:  Recent Labs Lab 03/23/17 0444  AST 31  ALT 26  ALKPHOS 105  BILITOT 0.4  PROT 6.3*  ALBUMIN 2.4*   No results for input(s): LIPASE, AMYLASE in the last 168 hours. No results for input(s): AMMONIA in the last 168 hours. Coagulation Profile: No results for input(s): INR, PROTIME in the last 168 hours. Cardiac Enzymes:  Recent Labs Lab 03/22/17 0030 03/23/17 1033 03/23/17 1427 03/23/17 1956  TROPONINI <0.03 <0.03 <0.03 <0.03   BNP (last 3 results) No results for input(s): PROBNP in the last 8760 hours. HbA1C:  Recent Labs  03/22/17 1219  HGBA1C 9.7*   CBG:  Recent Labs Lab 03/24/17 0801 03/24/17 1121 03/24/17 1637 03/24/17 2059 03/25/17 0812  GLUCAP 108* 114* 181* 116* 187*   Lipid Profile:  Recent Labs  03/22/17 1219  CHOL 124  HDL 15*  LDLCALC 60  TRIG 409*  CHOLHDL 8.3   Thyroid Function Tests: No results for input(s): TSH, T4TOTAL, FREET4, T3FREE, THYROIDAB in the last 72 hours. Anemia Panel: No results for input(s): VITAMINB12, FOLATE, FERRITIN, TIBC, IRON, RETICCTPCT in the last 72 hours. Urine analysis:    Component Value Date/Time   COLORURINE YELLOW  03/22/2017 1500   APPEARANCEUR HAZY (A) 03/22/2017 1500   LABSPEC 1.039 (H) 03/22/2017 1500   PHURINE 5.0 03/22/2017 1500   GLUCOSEU 150 (A) 03/22/2017 1500   HGBUR LARGE (A) 03/22/2017 1500   BILIRUBINUR NEGATIVE 03/22/2017 1500   KETONESUR NEGATIVE 03/22/2017 1500   PROTEINUR 100 (A) 03/22/2017 1500   NITRITE NEGATIVE 03/22/2017 1500   LEUKOCYTESUR NEGATIVE 03/22/2017 1500   Sepsis Labs: Invalid input(s): PROCALCITONIN, LACTICIDVEN  Recent Results (from the past 240 hour(s))  Blood culture (routine x 2)     Status: None (Preliminary result)   Collection Time: 03/22/17  1:36 AM  Result Value Ref Range Status   Specimen Description BLOOD RIGHT ANTECUBITAL  Final   Special Requests   Final  BOTTLES DRAWN AEROBIC AND ANAEROBIC Blood Culture adequate volume   Culture   Final    NO GROWTH 3 DAYS Performed at [facility], [address] [place], [place] [address]    Report Status PENDING  Incomplete  Blood culture (routine x 2)     Status: None (Preliminary result)   Collection Time: 03/22/17  5:09 AM  Result Value Ref Range Status   Specimen Description BLOOD RIGHT ANTECUBITAL  Final   Special Requests   Final    BOTTLES DRAWN AEROBIC AND ANAEROBIC Blood Culture adequate volume   Culture   Final    NO GROWTH 3 DAYS Performed at [facility], [address], [place], [place] [address]    Report Status PENDING  Incomplete  Aerobic Culture (superficial specimen)     Status: None   Collection Time: 03/22/17  9:46 AM  Result Value Ref Range Status   Specimen Description ABSCESS RIGHT BUTTOCKS  Final   Special Requests NONE  Final   Gram Stain   Final    MODERATE WBC PRESENT,BOTH PMN AND MONONUCLEAR ABUNDANT GRAM POSITIVE COCCI IN PAIRS ABUNDANT GRAM VARIABLE ROD MODERATE GRAM POSITIVE COCCI IN CHAINS    Culture ABUNDANT STREPTOCOCCUS ANGINOSIS  Final   Report Status 03/24/2017 FINAL  Final   Organism ID, Bacteria STREPTOCOCCUS ANGINOSIS  Final       Susceptibility   Streptococcus anginosis - MIC*    PENICILLIN <=0.06 SENSITIVE Sensitive     CEFTRIAXONE 0.25 SENSITIVE Sensitive     ERYTHROMYCIN >=8 RESISTANT Resistant     LEVOFLOXACIN 1 SENSITIVE Sensitive     VANCOMYCIN 0.5 SENSITIVE Sensitive     * ABUNDANT STREPTOCOCCUS ANGINOSIS  MRSA PCR Screening     Status: None   Collection Time: 03/22/17  3:00 PM  Result Value Ref Range Status   MRSA by PCR NEGATIVE NEGATIVE Final    Comment:        The GeneXpert MRSA Assay (FDA approved for NASAL specimens only), is one component of a comprehensive MRSA colonization surveillance program. It is not intended to diagnose MRSA infection nor to guide or monitor treatment for MRSA infections.       Radiology Studies: No results found.   Scheduled Meds: . aspirin EC  81 mg Oral Daily  . enoxaparin (LOVENOX) injection  40 mg Subcutaneous Q24H  . insulin aspart  0-9 Units Subcutaneous TID WC  . insulin aspart protamine- aspart  60 Units Subcutaneous BID WC  . lidocaine (PF)  30 mL Subcutaneous Once  . propofol  0.5 mg/kg Intravenous Once  . saccharomyces boulardii  250 mg Oral BID   Continuous Infusions: . 0.9 % NaCl with KCl 40 mEq / L 125 mL/hr (03/24/17 1914)  . cefTRIAXone (ROCEPHIN)  IV Stopped (03/24/17 1637)     Pamella Pert, MD, PhD [facility] Pager [phone]  If 7PM-7AM, please contact night-coverage [email or web address] Password [facility] 03/25/2017, 11:51 AM

## 2017-03-25 NOTE — Progress Notes (Addendum)
PT HYDROTHERAPY  TX   NOTE  03/25/17 1000  Subjective Assessment  Subjective i am about to refuse all of this  Patient and Family Stated Goals agreed to wound healing  Date of Onset (present on adm/bedside deb)  Prior Treatments I and D  Evaluation and Treatment  Evaluation and Treatment Procedures Explained to Patient/Family Yes  Evaluation and Treatment Procedures agreed to  Wound / Incision (Open or Dehisced) 03/23/17 Diabetic ulcer Rectum Right malodorous wound on right perrectal area.  Date First Assessed/Time First Assessed: 03/23/17 1217   Wound Type: Diabetic ulcer  Location: Rectum  Location Orientation: Right  Wound Description (Comments): malodorous wound on right perrectal area.  Present on Admission: Yes  Dressing Type Moist to dry  Dressing Change Frequency PRN  Site / Wound Assessment Painful;Pale;Yellow;Brown;Red  % Wound base Red or Granulating 15%  % Wound base Yellow/Fibrinous Exudate 85%  Drainage Amount Moderate  Drainage Description Purulent  Non-staged Wound Description Not applicable  Treatment Cleansed;Debridement (Selective);Hydrotherapy (Pulse lavage);Packing (Saline gauze)  Hydrotherapy  Pulsed lavage therapy - wound location right perirectal  Pulsed Lavage with Suction (psi) 8 psi  Pulsed Lavage with Suction - Normal Saline Used 1000 mL  Pulsed Lavage Tip Tip with splash shield  Selective Debridement  Selective Debridement - Location perirectal  Selective Debridement - Tools Used Forceps;Scissors  Selective Debridement - Tissue Removed dark, odorous slough  Wound Therapy - Assess/Plan/Recommendations  Wound Therapy - Clinical Statement night RN observed potential stool coming from wound when pt had bowel movement; pt states she just wasn't cleaned; pt reports 10/10 pain despite being premedicated;    *new tunnel located ~ 9 to 10o'clock with depth of approximately 1.5cm or greater which indicates possibility of  colorectal fistula ;  recommend further  investigation per surgeon  Wound Therapy - Functional Problem List is mobile with assistance  Factors Delaying/Impairing Wound Healing Diabetes Mellitus;Substance abuse  Hydrotherapy Plan Debridement;Dressing change;Pulsatile lavage with suction  Wound Therapy - Frequency 6X / week  Wound Therapy - Follow Up Recommendations Home health RN  Wound Plan PLS and dressing changes  Wound Therapy Goals - Improve the function of patient's integumentary system by progressing the wound(s) through the phases of wound healing by:  Decrease Necrotic Tissue to 50  Decrease Necrotic Tissue - Progress Progressing toward goal  Increase Granulation Tissue to 50  Increase Granulation Tissue - Progress Progressing toward goal  Improve Drainage Characteristics Min  Improve Drainage Characteristics - Progress Progressing toward goal  Patient/Family will be able to  reposition self from buttocks  Patient/Family Instruction Goal - Progress Progressing toward goal  Goals/treatment plan/discharge plan were made with and agreed upon by patient/family Yes  Time For Goal Achievement 2 weeks  Wound Therapy - Potential for Goals Good  Drucilla Chalet, PT Pager: [phone] 03/25/2017

## 2017-03-26 DIAGNOSIS — I2510 Atherosclerotic heart disease of native coronary artery without angina pectoris: Secondary | ICD-10-CM

## 2017-03-26 DIAGNOSIS — Z794 Long term (current) use of insulin: Secondary | ICD-10-CM

## 2017-03-26 DIAGNOSIS — I2583 Coronary atherosclerosis due to lipid rich plaque: Secondary | ICD-10-CM

## 2017-03-26 DIAGNOSIS — E118 Type 2 diabetes mellitus with unspecified complications: Secondary | ICD-10-CM

## 2017-03-26 LAB — GLUCOSE, CAPILLARY: Glucose-Capillary: 80 mg/dL (ref 65–99)

## 2017-03-26 MED ORDER — ASPIRIN 81 MG PO TBEC: 81.0000 mg | DELAYED_RELEASE_TABLET | Freq: Every day | ORAL | 1 refills | Status: AC

## 2017-03-26 MED ORDER — HYDROCODONE-ACETAMINOPHEN 5-325 MG PO TABS: 1.0000 | ORAL_TABLET | Freq: Four times a day (QID) | ORAL | 0 refills | Status: AC | PRN

## 2017-03-26 MED ORDER — SACCHAROMYCES BOULARDII 250 MG PO CAPS: 250.0000 mg | ORAL_CAPSULE | Freq: Two times a day (BID) | ORAL | 0 refills | Status: AC

## 2017-03-26 MED ORDER — AMOXICILLIN-POT CLAVULANATE 875-125 MG PO TABS: 1.0000 | ORAL_TABLET | Freq: Two times a day (BID) | ORAL | 0 refills | Status: AC

## 2017-03-26 NOTE — Discharge Instructions (Signed)
How to Take a Sitz Bath A sitz bath is a warm water bath that is taken while you are sitting down. The water should only come up to your hips and should cover your buttocks. Your health care provider may recommend a sitz bath to help you:  Clean the lower part of your body, including your genital area.  With itching.  With pain.  With sore muscles or muscles that tighten or spasm.  How to take a sitz bath Take 3-4 sitz baths per day or as told by your health care provider. 1. Partially fill a bathtub with warm water. You will only need the water to be deep enough to cover your hips and buttocks when you are sitting in it. 2. If your health care provider told you to put medicine in the water, follow the directions exactly. 3. Sit in the water and open the tub drain a little. 4. Turn on the warm water again to keep the tub at the correct level. Keep the water running constantly. 5. Soak in the water for 15-20 minutes or as told by your health care provider. 6. After the sitz bath, pat the affected area dry first. Do not rub it. 7. Be careful when you stand up after the sitz bath because you may feel dizzy.  Contact a health care provider if:  Your symptoms get worse. Do not continue with sitz baths if your symptoms get worse.  You have new symptoms. Do not continue with sitz baths until you talk with your health care provider. This information is not intended to replace advice given to you by your health care provider. Make sure you discuss any questions you have with your health care provider.   Dry dressing over site after each shower, Stiz bath and dressing change.  Perirectal Abscess An abscess is an infected area that contains a collection of pus. A perirectal abscess is an abscess that is near the opening of the anus or around the rectum. A perirectal abscess can cause a lot of pain, especially during bowel movements. What are the causes? This condition is almost always caused  by an infection that starts in an anal gland. What increases the risk? This condition is more likely to develop in:  People with diabetes or inflammatory bowel disease.  People whose body defense system (immune system) is weak.  People who have anal sex.  People who have a sexually transmitted disease (STD).  People who have certain kinds of cancers, such as rectal carcinoma, leukemia, or lymphoma.  What are the signs or symptoms? The main symptom of this condition is pain. The pain may be a throbbing pain that gets worse during bowel movements. Other symptoms include:  Fever.  Swelling.  Redness.  Bleeding.  Constipation.  How is this diagnosed? The condition is diagnosed with a physical exam. If the abscess is not visible, a health care provider may need to place a finger inside the rectum to find the abscess. Sometimes, imaging tests are done to determine the size and location of the abscess. These tests may include:  An ultrasound.  An MRI.  A CT scan.  How is this treated? This condition is usually treated with incision and drainage surgery. Incision and drainage surgery involves making an incision over the abscess to drain the pus. Treatment may also involve antibiotic medicine, pain medicine, stool softeners, or laxatives. Follow these instructions at home:  Take medicines only as directed by your health care provider.  If  you were prescribed an antibiotic, finish all of it even if you start to feel better.  To relieve pain, try sitting: ? In a warm, shallow bath (sitz bath). ? On a heating pad with the setting on low. ? On an inflatable donut-shaped cushion.  Follow any diet instructions as directed by your health care provider.  Keep all follow-up visits as directed by your health care provider. This is important. Contact a health care provider if:  Your abscess is bleeding.  You have pain, swelling, or redness that is getting worse.  You are  constipated.  You feel ill.  You have a fever.  Your symptoms return after the abscess has healed. This information is not intended to replace advice given to you by your health care provider. Make sure you discuss any questions you have with your health care provider.  Call [facility] - [phone] - to make an appointment with Dr. Sheliah Hatch in about 2 weeks.

## 2017-03-26 NOTE — Progress Notes (Signed)
Reviewed discharge information with patient and caregiver. Answered all questions. Patient/caregiver able to teach back medications and reasons to contact MD/911. Patient verbalizes importance of PCP follow up appointment. Patient set up with [facility] for dressing changes. Demonstrated to patient and brother how to change dressing and frequency (4 times a day and after BM's). Sent home with dressing supplies.  Earnest Conroy. Clelia Croft, RN

## 2017-03-26 NOTE — Progress Notes (Signed)
[facility] Office:  [phone] General Surgery Progress Note   LOS: 4 days  POD -     Chief Complaint: Rectal pain  Assessment and Plan: 1.  S/p Incision and drainage of perirectal abscess, 03/22/17, Dr. Franky Macho Kinsinger  WBC - 13,100 - 03/25/2017  Rocephin  Going home today.  Follow up wit Dr. Sheliah Hatch in about 2 weeks.  2.  Drug screen positive for cocaine and opiates. 3.  Acute renal insufficiency 4.  COPD 5.  Diabetes mellitus- poor control - hemoglobin A1c 9.7 6.  Anemia  Hgb - 8.3 - 03/25/2017 7.  On chronic disability  Says she's on home O2 for her COPD 8. DVT prophylaxis - Lovenox   Principal Problem:   Sepsis (HCC) Active Problems:   Chronic obstructive pulmonary disease (HCC)   Diabetes mellitus (HCC)   Essential hypertension   Hyperlipidemia   Obesity (BMI 30-39.9)   Coronary artery disease  Subjective:  Doing well.  Ready for discharge. She lives with her mother.  Is on disability.  Objective:   Vitals:   03/25/17 2115 03/26/17 0600  BP: (!) 154/75 135/78  Pulse: 94 89  Resp: 16 18  Temp: 98.2 F (36.8 C) 98.4 F (36.9 C)  SpO2: 100% 100%     Intake/Output from previous day:  09/22 0701 - 09/23 0700 In: 3290 [P.O.:240; I.V.:3000; IV Piggyback:50] Out: 3300 [Urine:3300]  Intake/Output this shift:  No intake/output data recorded.   Physical Exam:   General: Obese AA F who is alert and oriented.    HEENT: Normal. Pupils equal. .   Lungs: Clear   Abdomen: Soft   Wound: Right buttocks wound is generally clean.   Lab Results:     Recent Labs  03/25/17 0419  WBC 13.1*  HGB 8.0*  HCT 23.3*  PLT 365    BMET    Recent Labs  03/25/17 0419  NA 137  K 4.5  CL 107  CO2 23  GLUCOSE 116*  BUN 14  CREATININE 1.11*  CALCIUM 8.3*    PT/INR  No results for input(s): LABPROT, INR in the last 72 hours.  ABG  No results for input(s): PHART, HCO3 in the last 72 hours.  Invalid input(s): PCO2,  PO2   Studies/Results:  No results found.   Anti-infectives:   Anti-infectives    Start     Dose/Rate Route Frequency Ordered Stop   03/26/17 0000  amoxicillin-clavulanate (AUGMENTIN) 875-125 MG tablet     1 tablet Oral Every 12 hours 03/26/17 0823 04/09/17 2359   03/24/17 1600  cefTRIAXone (ROCEPHIN) 2 g in dextrose 5 % 50 mL IVPB     2 g 100 mL/hr over 30 Minutes Intravenous Every 24 hours 03/24/17 1126     03/22/17 1400  vancomycin (VANCOCIN) IVPB 750 mg/150 ml premix  Status:  Discontinued     750 mg 150 mL/hr over 60 Minutes Intravenous Every 12 hours 03/22/17 0813 03/24/17 1126   03/22/17 1000  piperacillin-tazobactam (ZOSYN) IVPB 3.375 g  Status:  Discontinued     3.375 g 12.5 mL/hr over 240 Minutes Intravenous Every 8 hours 03/22/17 0813 03/24/17 1126   03/22/17 0500  clindamycin (CLEOCIN) IVPB 900 mg     900 mg 100 mL/hr over 30 Minutes Intravenous  Once 03/22/17 0455 03/22/17 0615   03/22/17 0130  vancomycin (VANCOCIN) IVPB 1000 mg/200 mL premix     1,000 mg 200 mL/hr over 60 Minutes Intravenous  Once 03/22/17 0119 03/22/17 0334   03/22/17 0130  piperacillin-tazobactam (ZOSYN) IVPB 3.375 g     3.375 g 100 mL/hr over 30 Minutes Intravenous  Once 03/22/17 0119 03/22/17 0230      Ovidio Kin, MD, FACS Pager: [phone] [facility] Office: [phone] 03/26/2017

## 2017-03-26 NOTE — Discharge Summary (Signed)
Physician Discharge Summary  Kathryn Dillon ZOX:[id number] DOB: 1960/11/30 DOA: 03/21/2017  PCP: Jackie Plum, MD  Admit date: 03/21/2017 Discharge date: 03/26/2017  Admitted From: home Disposition:  home  Recommendations for Outpatient Follow-up:  1. Follow up with PCP in 1-2 weeks 2. Follow up with Dr. Sheliah Hatch in 1-2 weeks 3. Augmentin for 14 days on d/c   Home Health: RN Equipment/Devices: none  Discharge Condition: stable CODE STATUS: Full code Diet recommendation: diabetic  HPI: Per Dr. Susie Cassette, 56 year old  female with a history of diabetes,  cocaine use, hypertension, dyslipidemia, COPD, apparently also with a history of coronary artery disease, who presented to the emergency room  with a chief complaint of chest pain described as substernal in location, associated with a slight cough without any associated symptoms of palpitations or shortness of breath. Patient admitted to ongoing cocaine use. Patient also complained of right buttock pain, no fever, no chills. Patient states that she has a history of coronary artery disease with stents in the past but lost follow-up to cardiology. Patient also has a history of peripheral vascular disease and is followed by vascular surgery in [facility]. She also complained of bilateral lower extremity pain and claudication with walking.  Hospital Course: Discharge Diagnoses:  Principal Problem:   Sepsis (HCC) Active Problems:   Chronic obstructive pulmonary disease (HCC)   Diabetes mellitus (HCC)   Essential hypertension   Hyperlipidemia   Obesity (BMI 30-39.9)   Coronary artery disease   Sepsis due to cellulitis/buttock abscess -Bedside incision and drainage done by general surgery on 9/19. She was then started on broad-spectrum antibiotics with vancomycin and Zosyn.  Cultures from I&D were sent and eventually speciated Streptococcus anginosus sensitive to penicillin. She was narrowed to Augmentin and is to take for 2  weeks. Surgery will follow up in office within 2 weeks prior to antibiotic end date ?  Rectocutaneous fistula -Concern for fistulous communication between the wound/abscess the GI tract as fecal material was noticed to be in the wound and wound care RN noticed a new tract was formed. D/w Dr. Ezzard Standing, even if present, this will not change current management and current recommendations are sitz baths and antibiotics Chest pain with reported CAD -Patient states that she has had cardiac catheterization in the past with stent placement, however this was not done here.  She underwent a VQ scan which was negative for PE.  Cardiac enzymes have remained negative.  She underwent a 2D echo on admission which showed an ejection fraction of 60-65%, no wall motion abnormalities. Atypical quality, unlikely cardiac.  Chest pain is now resolved. Continue aspirin COPD -No wheezing, this appears stable Hypertension -stable Diabetes mellitus -Poorly controlled with a hemoglobin A1c of 9.7, at home she is on 70/30, will need ongoing outpatient management Peripheral arterial disease -She is followed by vascular surgery at [facility].  No evidence of limb ischemia currently Acute kidney injury -Likely in the setting of sepsis, prior creatinine has been normal, monitor, this is now resolved   Discharge Instructions   Allergies as of 03/26/2017      Reactions   Lisinopril Cough      Medication List    STOP taking these medications   diclofenac 75 MG EC tablet Commonly known as:  VOLTAREN   meloxicam 15 MG tablet Commonly known as:  MOBIC     TAKE these medications   amoxicillin-clavulanate 875-125 MG tablet Commonly known as:  AUGMENTIN Take 1 tablet by mouth every 12 (twelve) hours.  aspirin 81 MG EC tablet Take 1 tablet (81 mg total) by mouth daily.   HUMULIN 70/30 (70-30) 100 UNIT/ML injection Generic drug:  insulin NPH-regular Human Inject 80 Units into the skin 2 (two) times daily with a meal.     HYDROcodone-acetaminophen 5-325 MG tablet Commonly known as:  NORCO/VICODIN Take 1 tablet by mouth every 6 (six) hours as needed for severe pain.   OXYGEN Inhale into the lungs as needed. 2L [place] PRN   saccharomyces boulardii 250 MG capsule Commonly known as:  FLORASTOR Take 1 capsule (250 mg total) by mouth 2 (two) times daily.            Discharge Care Instructions        Start     Ordered   03/26/17 0000  aspirin EC 81 MG EC tablet  Daily     03/26/17 0823   03/26/17 0000  HYDROcodone-acetaminophen (NORCO/VICODIN) 5-325 MG tablet  Every 6 hours PRN     03/26/17 0823   03/26/17 0000  saccharomyces boulardii (FLORASTOR) 250 MG capsule  2 times daily     03/26/17 0823   03/26/17 0000  amoxicillin-clavulanate (AUGMENTIN) 875-125 MG tablet  Every 12 hours     03/26/17 0823     Follow-up Information    Surgery, [facility] Follow up.   Specialty:  General Surgery Why:  Our office will call with an appointment in 3 weeks. Continue Sitz baths 3-4 times per day.  Continue to shower after sitz bath and bowel movement.  Dry dressing over site after each shower, stiz bath and dressing change.  Bring photo ID & Clinical cytogeneticist information: [address] [place] [address] [phone]        [facility], [facility] Follow up.   Why:  Home Health RN-agency will call to arrange initial appt Contact information: [address] [place] [address] [phone]          Allergies  Allergen Reactions  . Lisinopril Cough   Consultations:  General surgery   Procedures/Studies:  2D echo  Study Conclusions - Left ventricle: The cavity size was normal. Systolic function was normal. The estimated ejection fraction was in the range of 60% to 65%. Wall motion was normal; there were no regional wall motion abnormalities. Left ventricular diastolic function parameters were normal.   Dg Chest 2 View  Result Date:  03/22/2017 CLINICAL DATA:  Chest pain EXAM: CHEST  2 VIEW COMPARISON:  None. FINDINGS: The heart size and mediastinal contours are within normal limits. Both lungs are clear. The visualized skeletal structures are unremarkable. IMPRESSION: No active cardiopulmonary disease. Electronically Signed   By: Deatra Robinson M.D.   On: 03/22/2017 00:19   Ct Pelvis W Contrast  Result Date: 03/22/2017 CLINICAL DATA:  Abscess along the right side of the buttocks. White cell count 15.8. History of hypertension and diabetes. EXAM: CT PELVIS WITH CONTRAST TECHNIQUE: Multidetector CT imaging of the pelvis was performed using the standard protocol following the bolus administration of intravenous contrast. CONTRAST:  80 mL Isovue-300 COMPARISON:  None. FINDINGS: Urinary Tract: Bladder wall is not thickened and no filling defects are identified. Bowel: Visualized portions of the colon and small bowel are normal in caliber without inflammatory change. Vascular/Lymphatic: Calcification in the iliac arteries. Reproductive:  Uterus and ovaries are not enlarged. Other: Infiltration throughout the peroneal fat in the right ischiorectal fossa and extending superiorly to the level of the inferior coccyx. Soft tissue infiltration and gas  collection extend to the midline at the gluteal crease. Largest collection measures about 4.4 x 8.7 cm. Changes are consistent with abscess and cellulitis caused by gas-forming organism. Musculoskeletal: Mild degenerative changes in the hips. Partial sacralization of L5. No destructive bone lesions. IMPRESSION: Large gas containing abscess in the right ischiorectal fossa with surrounding cellulitis. Electronically Signed   By: Burman Nieves M.D.   On: 03/22/2017 04:12   Nm Pulmonary Perf And Vent  Result Date: 03/22/2017 CLINICAL DATA:  High pre high pretest probability for acute pulmonary embolism. EXAM: NUCLEAR MEDICINE VENTILATION - PERFUSION LUNG SCAN TECHNIQUE: Ventilation images were obtained  in multiple projections using inhaled aerosol Tc-11m DTPA. Perfusion images were obtained in multiple projections after intravenous injection of Tc-81m MAA. RADIOPHARMACEUTICALS:  32.5 mCi Technetium-57m DTPA aerosol inhalation and 4.1 mCi Technetium-56m MAA IV COMPARISON:  Chest radiograph 03/22/2014 FINDINGS: Ventilation: No focal ventilation defect. Perfusion: No wedge shaped peripheral perfusion defects to suggest acute pulmonary embolism. IMPRESSION: No evidence acute pulmonary embolism. Electronically Signed   By: Genevive Bi M.D.   On: 03/22/2017 12:04     Subjective: - no chest pain, shortness of breath, no abdominal pain, nausea or vomiting.   Discharge Exam: Vitals:   03/26/17 0600 03/26/17 0953  BP: 135/78 123/74  Pulse: 89 94  Resp: 18 18  Temp: 98.4 F (36.9 C) 99.3 F (37.4 C)  SpO2: 100% 100%    General: Pt is alert, awake, not in acute distress Cardiovascular: RRR, S1/S2 +, no rubs, no gallops Respiratory: CTA bilaterally, no wheezing, no rhonchi Abdominal: Soft, NT, ND, bowel sounds + Extremities: no edema, no cyanosis    The results of significant diagnostics from this hospitalization (including imaging, microbiology, ancillary and laboratory) are listed below for reference.     Microbiology: Recent Results (from the past 240 hour(s))  Blood culture (routine x 2)     Status: None (Preliminary result)   Collection Time: 03/22/17  1:36 AM  Result Value Ref Range Status   Specimen Description BLOOD RIGHT ANTECUBITAL  Final   Special Requests   Final    BOTTLES DRAWN AEROBIC AND ANAEROBIC Blood Culture adequate volume   Culture   Final    NO GROWTH 4 DAYS Performed at [facility], [address], [place], [place] [address]    Report Status PENDING  Incomplete  Blood culture (routine x 2)     Status: None (Preliminary result)   Collection Time: 03/22/17  5:09 AM  Result Value Ref Range Status   Specimen Description BLOOD RIGHT ANTECUBITAL  Final    Special Requests   Final    BOTTLES DRAWN AEROBIC AND ANAEROBIC Blood Culture adequate volume   Culture   Final    NO GROWTH 4 DAYS Performed at [facility], [address], [place], [place] [address]    Report Status PENDING  Incomplete  Aerobic Culture (superficial specimen)     Status: None   Collection Time: 03/22/17  9:46 AM  Result Value Ref Range Status   Specimen Description ABSCESS RIGHT BUTTOCKS  Final   Special Requests NONE  Final   Gram Stain   Final    MODERATE WBC PRESENT,BOTH PMN AND MONONUCLEAR ABUNDANT GRAM POSITIVE COCCI IN PAIRS ABUNDANT GRAM VARIABLE ROD MODERATE GRAM POSITIVE COCCI IN CHAINS    Culture ABUNDANT STREPTOCOCCUS ANGINOSIS  Final   Report Status 03/24/2017 FINAL  Final   Organism ID, Bacteria STREPTOCOCCUS ANGINOSIS  Final      Susceptibility  Streptococcus anginosis - MIC*    PENICILLIN <=0.06 SENSITIVE Sensitive     CEFTRIAXONE 0.25 SENSITIVE Sensitive     ERYTHROMYCIN >=8 RESISTANT Resistant     LEVOFLOXACIN 1 SENSITIVE Sensitive     VANCOMYCIN 0.5 SENSITIVE Sensitive     * ABUNDANT STREPTOCOCCUS ANGINOSIS  MRSA PCR Screening     Status: None   Collection Time: 03/22/17  3:00 PM  Result Value Ref Range Status   MRSA by PCR NEGATIVE NEGATIVE Final    Comment:        The GeneXpert MRSA Assay (FDA approved for NASAL specimens only), is one component of a comprehensive MRSA colonization surveillance program. It is not intended to diagnose MRSA infection nor to guide or monitor treatment for MRSA infections.      Labs: BNP (last 3 results) No results for input(s): BNP in the last 8760 hours. Basic Metabolic Panel:  Recent Labs Lab 03/22/17 0031 03/22/17 1219 03/23/17 0444 03/25/17 0419  NA 133*  --  135 137  K 3.0*  --  4.1 4.5  CL 99*  --  104 107  CO2 21*  --  21* 23  GLUCOSE 252*  --  358* 116*  BUN 21*  --  20 14  CREATININE 1.44*  --  1.28* 1.11*  CALCIUM 8.7*  --  8.3* 8.3*  MG  --  2.0  --   --     Liver Function Tests:  Recent Labs Lab 03/23/17 0444  AST 31  ALT 26  ALKPHOS 105  BILITOT 0.4  PROT 6.3*  ALBUMIN 2.4*   No results for input(s): LIPASE, AMYLASE in the last 168 hours. No results for input(s): AMMONIA in the last 168 hours. CBC:  Recent Labs Lab 03/22/17 0031 03/23/17 0444 03/25/17 0419  WBC 15.8* 14.1* 13.1*  HGB 9.4* 8.4* 8.0*  HCT 28.2* 26.0* 23.3*  MCV 84.2 84.1 83.5  PLT 244 279 365   Cardiac Enzymes:  Recent Labs Lab 03/22/17 0030 03/23/17 1033 03/23/17 1427 03/23/17 1956  TROPONINI <0.03 <0.03 <0.03 <0.03   BNP: Invalid input(s): POCBNP CBG:  Recent Labs Lab 03/25/17 0812 03/25/17 1201 03/25/17 1637 03/25/17 2113 03/26/17 0810  GLUCAP 187* 158* 125* 88 80   D-Dimer No results for input(s): DDIMER in the last 72 hours. Hgb A1c No results for input(s): HGBA1C in the last 72 hours. Lipid Profile No results for input(s): CHOL, HDL, LDLCALC, TRIG, CHOLHDL, LDLDIRECT in the last 72 hours. Thyroid function studies No results for input(s): TSH, T4TOTAL, T3FREE, THYROIDAB in the last 72 hours.  Invalid input(s): FREET3 Anemia work up No results for input(s): VITAMINB12, FOLATE, FERRITIN, TIBC, IRON, RETICCTPCT in the last 72 hours. Urinalysis    Component Value Date/Time   COLORURINE YELLOW 03/22/2017 1500   APPEARANCEUR HAZY (A) 03/22/2017 1500   LABSPEC 1.039 (H) 03/22/2017 1500   PHURINE 5.0 03/22/2017 1500   GLUCOSEU 150 (A) 03/22/2017 1500   HGBUR LARGE (A) 03/22/2017 1500   BILIRUBINUR NEGATIVE 03/22/2017 1500   KETONESUR NEGATIVE 03/22/2017 1500   PROTEINUR 100 (A) 03/22/2017 1500   NITRITE NEGATIVE 03/22/2017 1500   LEUKOCYTESUR NEGATIVE 03/22/2017 1500   Sepsis Labs Invalid input(s): PROCALCITONIN,  WBC,  LACTICIDVEN   Time coordinating discharge: 35 minutes  SIGNED:  Pamella Pert, MD  [facility] 03/26/2017, 3:14 PM Pager [phone]  If 7PM-7AM, please contact  night-coverage [email or web address] Password [email or web address]

## 2017-03-26 NOTE — Care Management Note (Addendum)
Case Management Note  Patient Details  Name: Kathryn Dillon MRN: [id number] Date of Birth: [birth year]  Subjective/Objective:  S/p Incision and drainage of perirectal abscess, 03/22/17                  Action/Plan: Discharge Planning: NCM spoke to pt and offered choice for HH/list provided. Pt requested [facility] for HH. Pt states she has RW and bedside commode at home. Lives at home with family. Unit RN will instruct pt on how to do dressing changes and provide a supply to complete for today and tomorrow. HH agency has 24-48 hours before they do start of care. Message sent to attending for instruction on [facility] RN order on dressing change.   PCP Jackie Plum  Expected Discharge Date:  03/26/17               Expected Discharge Plan:  Home w Home Health Services  In-House Referral:  NA  Discharge planning Services  CM Consult  Post Acute Care Choice:  Home Health Choice offered to:  Patient  DME Arranged:  N/A DME Agency:  NA  HH Arranged:  RN HH Agency:  [facility]  Status of Service:  Completed, signed off  If discussed at Long Length of Stay Meetings, dates discussed:    Additional Comments:  Elliot Cousin, RN 03/26/2017, 11:55 AM

## 2017-03-27 LAB — CULTURE, BLOOD (ROUTINE X 2)
Culture: NO GROWTH
Culture: NO GROWTH
Special Requests: ADEQUATE
Special Requests: ADEQUATE

## 2018-02-20 IMAGING — CT CT PELVIS W/ CM
2 of 3 series · 17 of 46 positions shown, 19 images · IV contrast (isovue)
Comparison: None.

CLINICAL DATA: Abscess along the right side of the buttocks. White
cell count 15.8. History of hypertension and diabetes.

EXAM:
CT PELVIS WITH CONTRAST
TECHNIQUE: Multidetector CT imaging of the pelvis was performed using the
standard protocol following the bolus administration of intravenous
contrast.
CONTRAST:  80 mL Isovue-2II

[Series 2: pelvis with · axial · 0.72mm/px · z∈[+1029,+1279]mm · 14 of 58 slices shown, 16 images]
[im 4/58  soft-tissue]
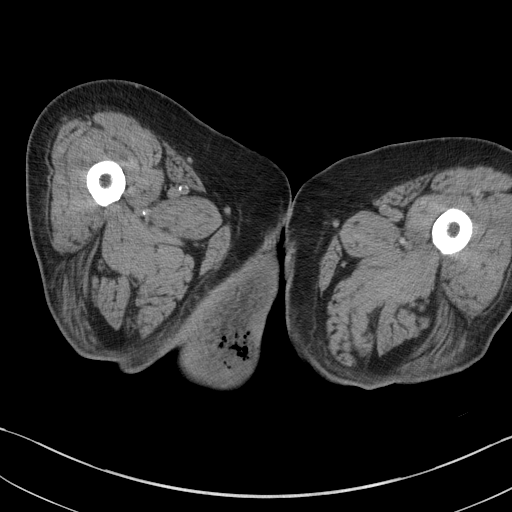
[im 4/58  bone]
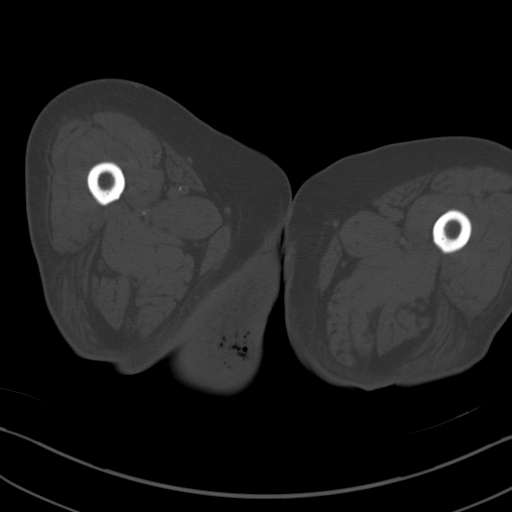
[im 8/58  soft-tissue]
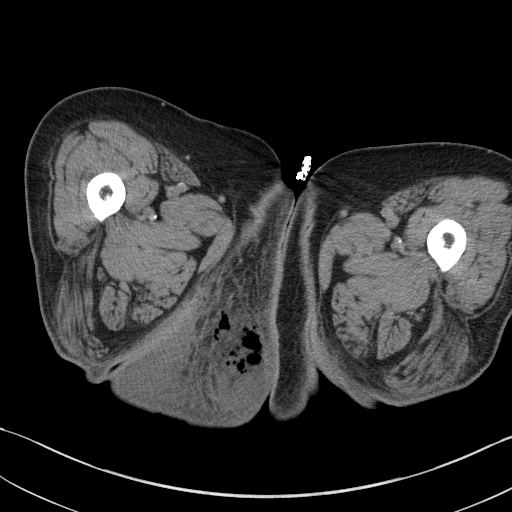
[im 12/58  soft-tissue]
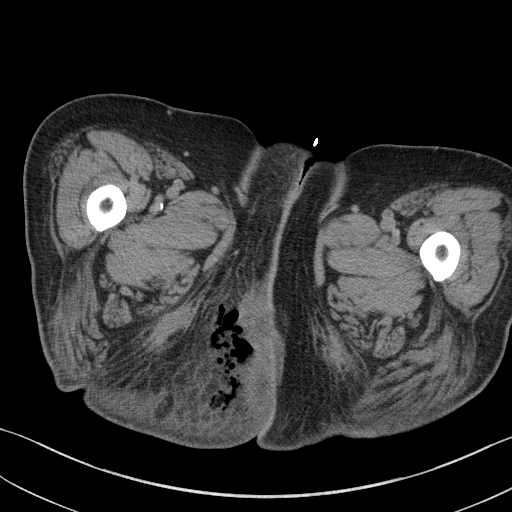
[im 15/58  soft-tissue]
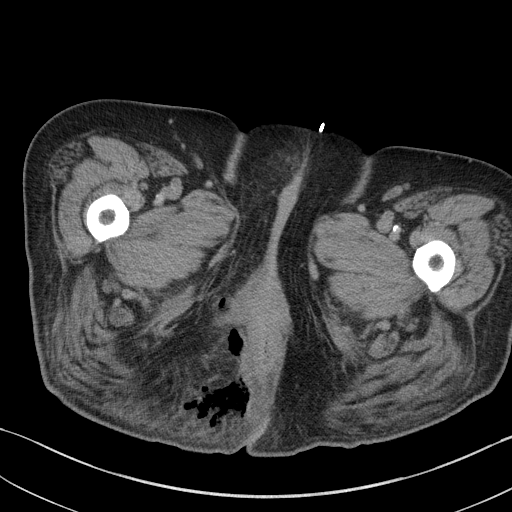
[im 19/58  soft-tissue]
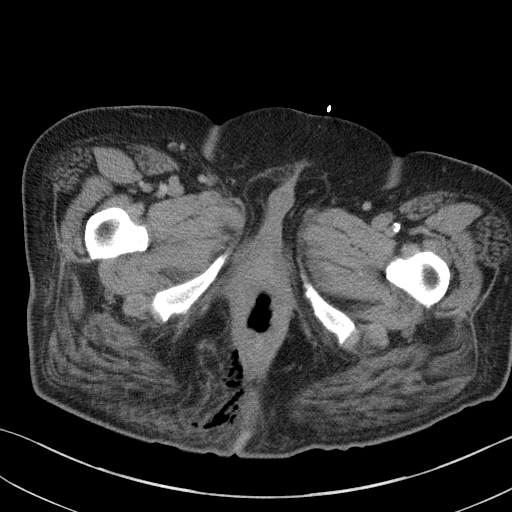
[im 23/58  soft-tissue]
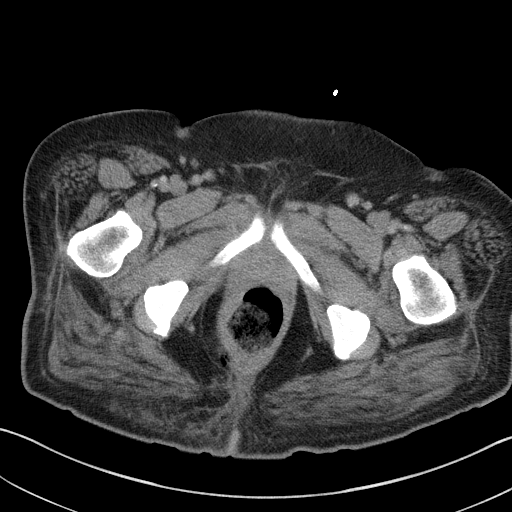
[im 26/58  soft-tissue]
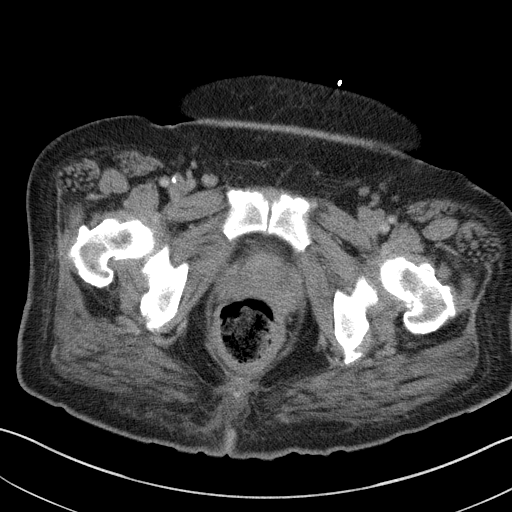
[im 32/58  soft-tissue]
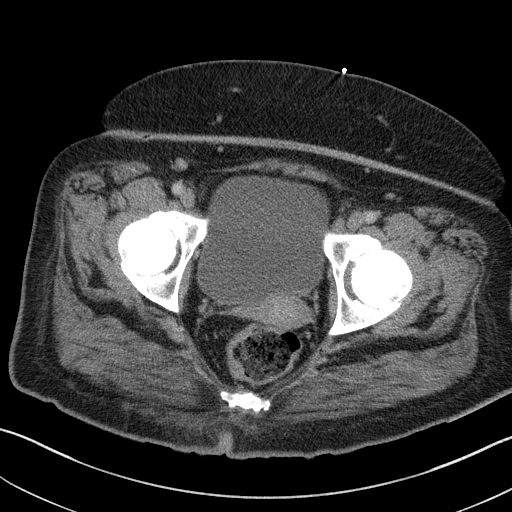
[im 35/58  soft-tissue]
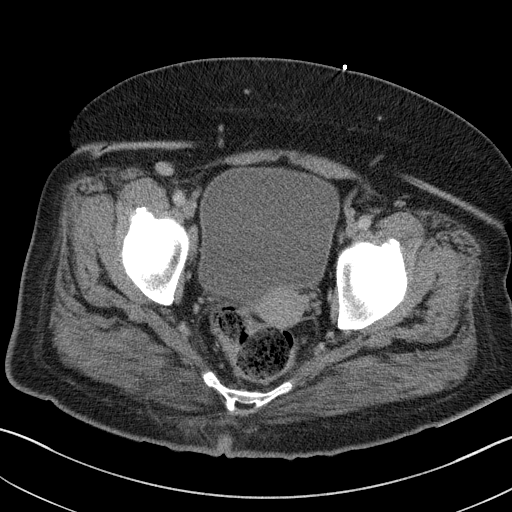
[im 35/58  bone]
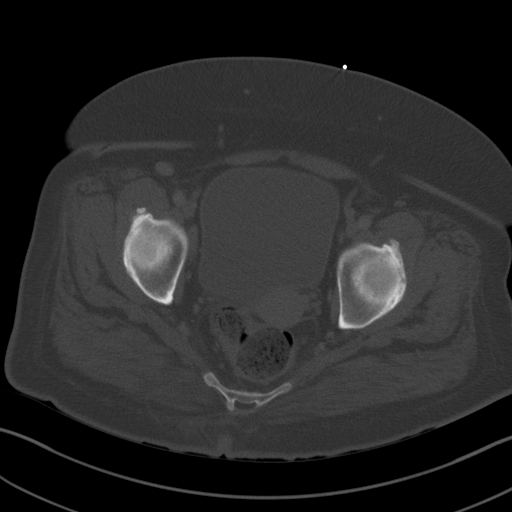
[im 39/58  soft-tissue]
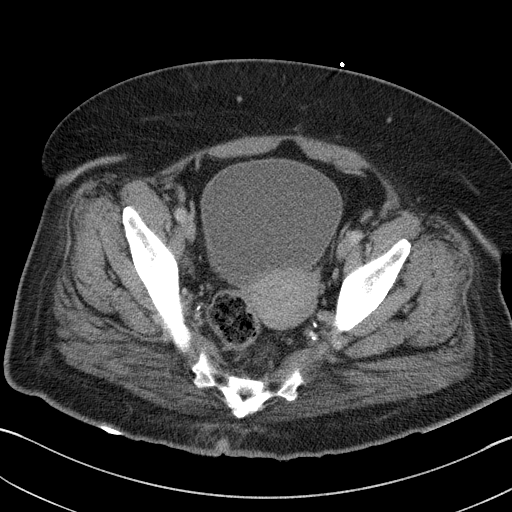
[im 43/58  soft-tissue]
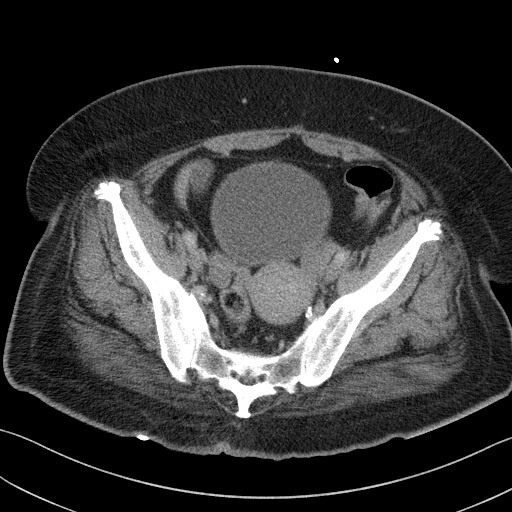
[im 46/58  soft-tissue]
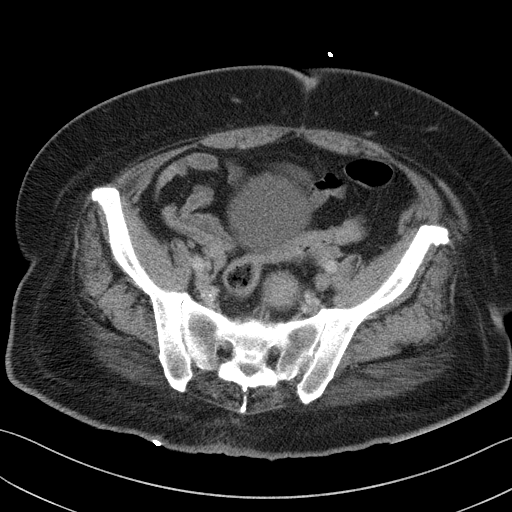
[im 50/58  soft-tissue]
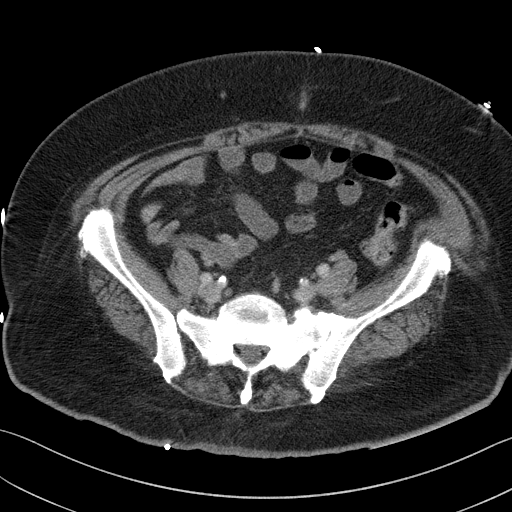
[im 54/58  soft-tissue]
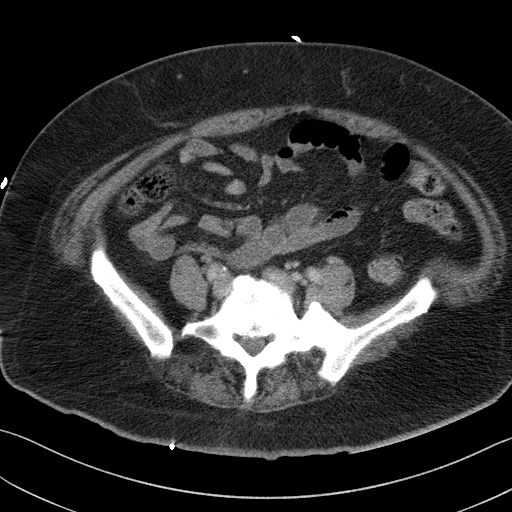

[Series 3: coronal images · coronal · 0.56mm/px · 3 of 140 slices shown]
[im 47/140  soft-tissue]
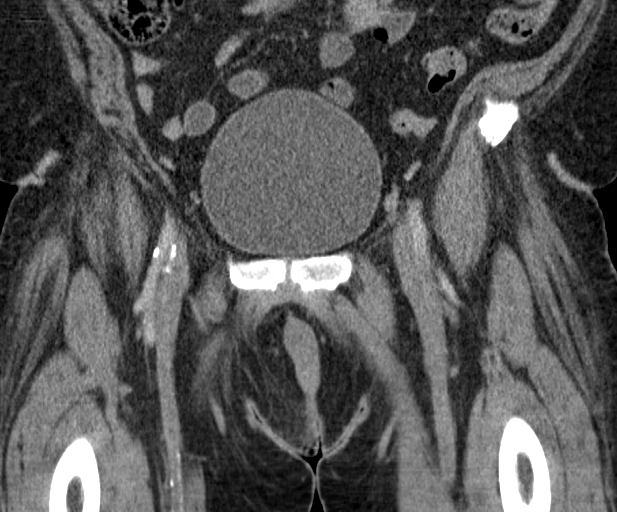
[im 62/140  soft-tissue]
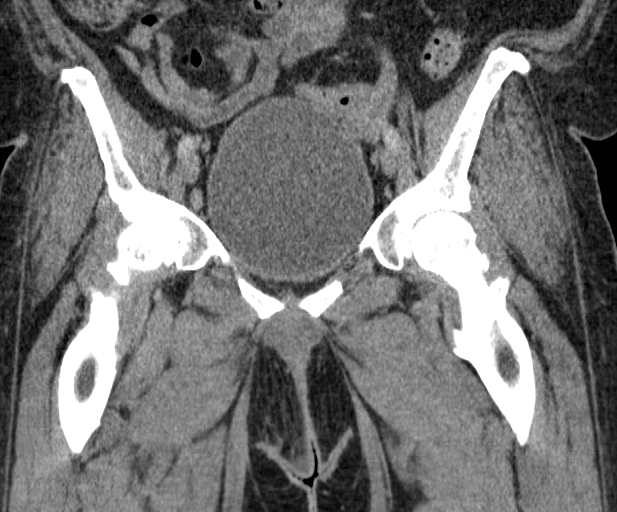
[im 78/140  soft-tissue]
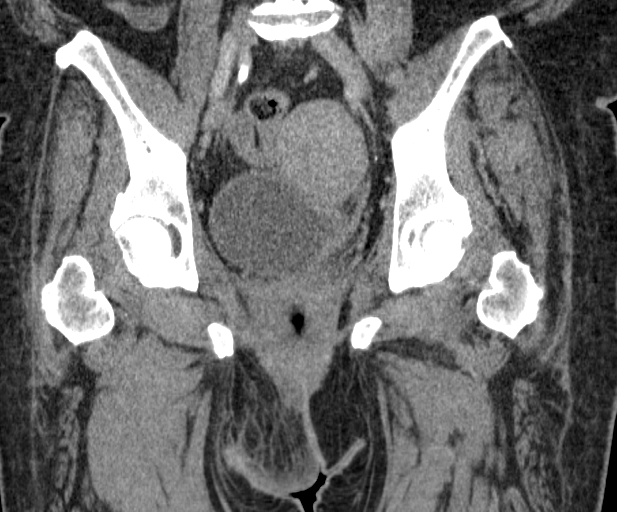

[17 of 46 positions shown; findings below may reference images not displayed]

FINDINGS: Urinary Tract: Bladder wall is not thickened and no filling defects
are identified.

Bowel: Visualized portions of the colon and small bowel are normal
in caliber without inflammatory change.

Vascular/Lymphatic: Calcification in the iliac arteries.

Reproductive:  Uterus and ovaries are not enlarged.

Other: Infiltration throughout the peroneal fat in the right
ischiorectal fossa and extending superiorly to the level of the
inferior coccyx. Soft tissue infiltration and gas collection extend
to the midline at the gluteal crease. Largest collection measures
about 4.4 x 8.7 cm. Changes are consistent with abscess and
cellulitis caused by gas-forming organism.

Musculoskeletal: Mild degenerative changes in the hips. Partial
sacralization of L5. No destructive bone lesions.
IMPRESSION: Large gas containing abscess in the right ischiorectal fossa with
surrounding cellulitis.

## 2020-07-16 ENCOUNTER — Institutional Professional Consult (permissible substitution): Payer: Medicaid Other | Admitting: Pulmonary Disease

## 2020-12-28 ENCOUNTER — Other Ambulatory Visit: Payer: Self-pay

## 2020-12-28 ENCOUNTER — Encounter (HOSPITAL_BASED_OUTPATIENT_CLINIC_OR_DEPARTMENT_OTHER): Payer: Self-pay | Admitting: Emergency Medicine

## 2020-12-28 ENCOUNTER — Emergency Department (HOSPITAL_BASED_OUTPATIENT_CLINIC_OR_DEPARTMENT_OTHER)
Admission: EM | Admit: 2020-12-28 | Discharge: 2020-12-28 | Discharge status: Against Medical Advice | Payer: Medicaid Other | Attending: Emergency Medicine | Admitting: Emergency Medicine

## 2020-12-28 DIAGNOSIS — Z20822 Contact with and (suspected) exposure to covid-19: Secondary | ICD-10-CM | POA: Insufficient documentation

## 2020-12-28 DIAGNOSIS — R300 Dysuria: Secondary | ICD-10-CM | POA: Diagnosis present

## 2020-12-28 DIAGNOSIS — Z7982 Long term (current) use of aspirin: Secondary | ICD-10-CM | POA: Diagnosis not present

## 2020-12-28 DIAGNOSIS — F17210 Nicotine dependence, cigarettes, uncomplicated: Secondary | ICD-10-CM | POA: Insufficient documentation

## 2020-12-28 DIAGNOSIS — I2510 Atherosclerotic heart disease of native coronary artery without angina pectoris: Secondary | ICD-10-CM | POA: Diagnosis not present

## 2020-12-28 DIAGNOSIS — E875 Hyperkalemia: Secondary | ICD-10-CM | POA: Diagnosis not present

## 2020-12-28 DIAGNOSIS — N179 Acute kidney failure, unspecified: Secondary | ICD-10-CM | POA: Diagnosis not present

## 2020-12-28 DIAGNOSIS — J449 Chronic obstructive pulmonary disease, unspecified: Secondary | ICD-10-CM | POA: Insufficient documentation

## 2020-12-28 DIAGNOSIS — E119 Type 2 diabetes mellitus without complications: Secondary | ICD-10-CM | POA: Insufficient documentation

## 2020-12-28 DIAGNOSIS — I10 Essential (primary) hypertension: Secondary | ICD-10-CM | POA: Diagnosis not present

## 2020-12-28 LAB — CBC WITH DIFFERENTIAL/PLATELET
Abs Immature Granulocytes: 0.01 10*3/uL (ref 0.00–0.07)
Basophils Absolute: 0 10*3/uL (ref 0.0–0.1)
Basophils Relative: 0 %
Eosinophils Absolute: 0.2 10*3/uL (ref 0.0–0.5)
Eosinophils Relative: 3 %
HCT: 27.4 % — ABNORMAL LOW (ref 36.0–46.0)
Hemoglobin: 8.6 g/dL — ABNORMAL LOW (ref 12.0–15.0)
Immature Granulocytes: 0 %
Lymphocytes Relative: 36 %
Lymphs Abs: 2.4 10*3/uL (ref 0.7–4.0)
MCH: 26.7 pg (ref 26.0–34.0)
MCHC: 31.4 g/dL (ref 30.0–36.0)
MCV: 85.1 fL (ref 80.0–100.0)
Monocytes Absolute: 0.6 10*3/uL (ref 0.1–1.0)
Monocytes Relative: 9 %
Neutro Abs: 3.6 10*3/uL (ref 1.7–7.7)
Neutrophils Relative %: 52 %
Platelets: 240 10*3/uL (ref 150–400)
RBC: 3.22 MIL/uL — ABNORMAL LOW (ref 3.87–5.11)
RDW: 14.5 % (ref 11.5–15.5)
WBC: 6.9 10*3/uL (ref 4.0–10.5)
nRBC: 0 % (ref 0.0–0.2)

## 2020-12-28 LAB — BASIC METABOLIC PANEL
Anion gap: 8 (ref 5–15)
BUN: 96 mg/dL — ABNORMAL HIGH (ref 6–20)
CO2: 20 mmol/L — ABNORMAL LOW (ref 22–32)
Calcium: 8.2 mg/dL — ABNORMAL LOW (ref 8.9–10.3)
Chloride: 109 mmol/L (ref 98–111)
Creatinine, Ser: 7.57 mg/dL — ABNORMAL HIGH (ref 0.44–1.00)
GFR, Estimated: 6 mL/min — ABNORMAL LOW (ref >60)
Glucose, Bld: 129 mg/dL — ABNORMAL HIGH (ref 70–99)
Potassium: 5.6 mmol/L — ABNORMAL HIGH (ref 3.5–5.1)
Sodium: 137 mmol/L (ref 135–145)

## 2020-12-28 LAB — RESP PANEL BY RT-PCR (FLU A&B, COVID) ARPGX2
Influenza A by PCR: NEGATIVE
Influenza B by PCR: NEGATIVE
SARS Coronavirus 2 by RT PCR: NEGATIVE

## 2020-12-28 MED ORDER — SODIUM ZIRCONIUM CYCLOSILICATE 10 G PO PACK
10.0000 g | PACK | Freq: Once | ORAL | Status: AC
  Administered 2020-12-28: 10 g via ORAL
  Filled 2020-12-28: qty 1

## 2020-12-28 MED ORDER — SODIUM CHLORIDE 0.9 % IV BOLUS
1000.0000 mL | Freq: Once | INTRAVENOUS | Status: AC
  Administered 2020-12-28: 1000 mL via INTRAVENOUS

## 2020-12-28 NOTE — Discharge Instructions (Addendum)
Your kidney function has gotten significantly worse.  Typically this is something that gets hospitalized.  Your potassium was also elevated.  This is something that you would not experience symptoms but could spontaneously injure life.  Please return at anytime you would like to be reevaluated.  Call your nephrologist first thing in the morning if you decide not to come back.

## 2020-12-28 NOTE — ED Notes (Signed)
Pt unable to provide urine specimen. Requests to try IVF first.

## 2020-12-28 NOTE — ED Notes (Signed)
Pt states she is still unable to provide a urine sample.  

## 2020-12-28 NOTE — ED Triage Notes (Addendum)
Pt presents to ED Pov. Pt c/o dysuria. Pt reports that she is only urinating about once a day. Chronic kidney disease but was told to come here for blood work. Denies urinary urgency. Reports pain in lower back

## 2020-12-28 NOTE — ED Provider Notes (Signed)
[facility] EMERGENCY DEPARTMENT Provider Note   CSN: [id number] Arrival date & time: 12/28/20  1800     History Chief Complaint  Patient presents with   Dysuria    Kathryn Dillon is a 60 y.o. female.  60 yo F with a chief complaint of decreased urination.  Going on for the past couple days.  Having 1 urinary event a day.  Denies any significant discomfort with it.  Has been having some low back pain that is been going on for about 3 to 4 days prior to this.  Has chronic neuropathy to her legs but denies any new numbness or weakness to the legs.  Denies IV drug abuse.  Denies loss of bowel or bladder denies loss prior to sensation.  Has CKD and called her nephrologist who told her to come in to be evaluated for worsening renal dysfunction. Feels like she can empty her bladder ok.   The history is provided by the patient.  Dysuria Associated symptoms: no abdominal pain, no fever, no nausea and no vomiting   Illness Severity:  Moderate Onset quality:  Gradual Duration:  2 days Timing:  Constant Progression:  Worsening Chronicity:  New Associated symptoms: no abdominal pain, no chest pain, no congestion, no fever, no headaches, no myalgias, no nausea, no rhinorrhea, no shortness of breath, no vomiting and no wheezing       Past Medical History:  Diagnosis Date   COPD (chronic obstructive pulmonary disease) with chronic bronchitis (HCC)    Diabetes mellitus without complication (HCC)    H/O drug abuse (HCC)    H/O ETOH abuse    High cholesterol    Hypertension    PAD (peripheral artery disease) (HCC)     Patient Active Problem List   Diagnosis Date Noted   Sepsis (HCC) 03/22/2017   Shoulder injury, left, subsequent encounter 08/03/2016   Onychocryptosis 03/30/2016   Atherosclerosis of native artery of both lower extremities with intermittent claudication (HCC) 03/23/2016   Cigarette smoker 01/13/2016   Obesity (BMI 30-39.9) 11/05/2015   Substance abuse in  remission (HCC) 11/05/2015   Chronic obstructive pulmonary disease (HCC) 04/27/2015   Diabetes mellitus (HCC) 08/02/2013   Essential hypertension 08/02/2013   Hyperlipidemia 08/02/2013   Coronary artery disease 08/02/2013    History reviewed. No pertinent surgical history.   OB History   No obstetric history on file.     History reviewed. No pertinent family history.  Social History   Tobacco Use   Smoking status: Every Day    Pack years: 0.00    Types: Cigarettes   Smokeless tobacco: Never  Substance Use Topics   Alcohol use: No   Drug use: No    Home Medications Prior to Admission medications   Medication Sig Start Date End Date Taking? Authorizing Provider  aspirin EC 81 MG EC tablet Take 1 tablet (81 mg total) by mouth daily. 03/26/17   Leatha Gilding, MD  HYDROcodone-acetaminophen (NORCO/VICODIN) 5-325 MG tablet Take 1 tablet by mouth every 6 (six) hours as needed for severe pain. 03/26/17   Leatha Gilding, MD  insulin NPH-regular Human (HUMULIN 70/30) (70-30) 100 UNIT/ML injection Inject 80 Units into the skin 2 (two) times daily with a meal.    [provider]  OXYGEN Inhale into the lungs as needed. 2L [place] PRN    [provider]  saccharomyces boulardii (FLORASTOR) 250 MG capsule Take 1 capsule (250 mg total) by mouth 2 (two) times daily. 03/26/17  Leatha Gilding, MD    Allergies    Lisinopril  Review of Systems   Review of Systems  Constitutional:  Negative for chills and fever.  HENT:  Negative for congestion and rhinorrhea.   Eyes:  Negative for redness and visual disturbance.  Respiratory:  Negative for shortness of breath and wheezing.   Cardiovascular:  Negative for chest pain and palpitations.  Gastrointestinal:  Negative for abdominal pain, nausea and vomiting.  Genitourinary:  Positive for decreased urine volume. Negative for dysuria and urgency.  Musculoskeletal:  Positive for back pain (low back). Negative for arthralgias  and myalgias.  Skin:  Negative for pallor and wound.  Neurological:  Negative for dizziness and headaches.   Physical Exam Updated Vital Signs BP (!) 167/86   Pulse 85   Temp 98 F (36.7 C) (Oral)   Resp 16   Ht 5\' 4"  (1.626 m)   Wt 86.2 kg   SpO2 100%   BMI 32.61 kg/m   Physical Exam Vitals and nursing note reviewed.  Constitutional:      General: She is not in acute distress.    Appearance: She is well-developed. She is not diaphoretic.  HENT:     Head: Normocephalic and atraumatic.  Eyes:     Pupils: Pupils are equal, round, and reactive to light.  Cardiovascular:     Rate and Rhythm: Normal rate and regular rhythm.     Heart sounds: No murmur heard.   No friction rub. No gallop.  Pulmonary:     Effort: Pulmonary effort is normal.     Breath sounds: No wheezing or rales.  Abdominal:     General: There is no distension.     Palpations: Abdomen is soft.     Tenderness: There is no abdominal tenderness.  Musculoskeletal:        General: No tenderness.     Cervical back: Normal range of motion and neck supple.  Skin:    General: Skin is warm and dry.  Neurological:     Mental Status: She is alert and oriented to person, place, and time.  Psychiatric:        Behavior: Behavior normal.    ED Results / Procedures / Treatments   Labs (all labs ordered are listed, but only abnormal results are displayed) Labs Reviewed  CBC WITH DIFFERENTIAL/PLATELET - Abnormal; Notable for the following components:      Result Value   RBC 3.22 (*)    Hemoglobin 8.6 (*)    HCT 27.4 (*)    All other components within normal limits  BASIC METABOLIC PANEL - Abnormal; Notable for the following components:   Potassium 5.6 (*)    CO2 20 (*)    Glucose, Bld 129 (*)    BUN 96 (*)    Creatinine, Ser 7.57 (*)    Calcium 8.2 (*)    GFR, Estimated 6 (*)    All other components within normal limits  RESP PANEL BY RT-PCR (FLU A&B, COVID) [id number]  URINALYSIS, ROUTINE W REFLEX MICROSCOPIC     EKG EKG Interpretation  Date/Time:  Monday December 28 2020 18:44:29 EDT Ventricular Rate:  86 PR Interval:  143 QRS Duration: 88 QT Interval:  373 QTC Calculation: 447 R Axis:   39 Text Interpretation: Sinus rhythm Abnormal R-wave progression, early transition No significant change since last tracing Confirmed by 06-30-2005 [phone]) on 12/28/2020 6:54:26 PM  Radiology No results found.  Procedures Procedures   Medications Ordered in ED Medications  sodium zirconium cyclosilicate (LOKELMA) packet 10 g (has no administration in time range)  sodium chloride 0.9 % bolus 1,000 mL (1,000 mLs Intravenous New Bag/Given 12/28/20 1927)    ED Course  I have reviewed the triage vital signs and the nursing notes.  Pertinent labs & imaging results that were available during my care of the patient were reviewed by me and considered in my medical decision making (see chart for details).    MDM Rules/Calculators/A&P                          60 yo F with a chief complaint of decreased urinary output.  Going on for about 48 hours.  Called her nephrologist who suggested she come to the ED for evaluation.  Will obtain laboratory evaluation UA bladder scan bolus of IV fluids reassess.  Bladder scan with less than 200 cc.  Patient with acute kidney injury Baseline creatinine it looks like is about 2-1/2 up to 7-1/2 today BUN of 96.  I discussed results with the patient.  She would prefer to go to [facility] hospital if possible.  Potassium is 5.6 without any EKG changes we will give a dose of Lokelma.  I discussed the case with [facility] unfortunately they have no bed availability.  I offered to put the patient on a waiting list in which they would have her wait likely overnights and hopefully would have beds in the morning.  I discussed this with the patient and she has some things that she needs to do at home first.  She plans to take her mom's car back to her mom's house  and then would try to look for a ride to come back to the hospital.  I discussed with her that typically this is considered leaving AGAINST MEDICAL ADVICE.  Her potassium is high and that something that could spontaneously kill her.  Patient states understanding and does promise to return.  8:17 PM:  I have discussed the diagnosis/risks/treatment options with the patient and believe the pt to be eligible for discharge home to follow-up with PCP, nephrology. We also discussed returning to the ED immediately if new or worsening sx occur. We discussed the sx which are most concerning (e.g., sudden worsening pain, fever, inability to tolerate by mouth) that necessitate immediate return. Medications administered to the patient during their visit and any new prescriptions provided to the patient are listed below.  Medications given during this visit Medications  sodium zirconium cyclosilicate (LOKELMA) packet 10 g (has no administration in time range)  sodium chloride 0.9 % bolus 1,000 mL (1,000 mLs Intravenous New Bag/Given 12/28/20 1927)     The patient appears reasonably screen and/or stabilized for discharge and I doubt any other medical condition or other [facility] requiring further screening, evaluation, or treatment in the ED at this time prior to discharge.   Final Clinical Impression(s) / ED Diagnoses Final diagnoses:  AKI (acute kidney injury) (HCC)  Hyperkalemia    Rx / DC Orders ED Discharge Orders     None        Melene Plan, DO 12/28/20 2017

## 2020-12-28 NOTE — ED Notes (Signed)
Pt. Voids infrequently; usually 1-2 times/day and in the morning.  Has not voided while here.

## 2021-01-04 ENCOUNTER — Other Ambulatory Visit: Payer: Self-pay

## 2021-01-04 ENCOUNTER — Encounter (HOSPITAL_COMMUNITY): Payer: Self-pay | Admitting: Emergency Medicine

## 2021-01-04 ENCOUNTER — Emergency Department (HOSPITAL_COMMUNITY)
Admission: EM | Admit: 2021-01-04 | Discharge: 2021-01-05 | Disposition: A | Discharge status: Home / Self Care | Payer: Medicaid Other | Attending: Emergency Medicine | Admitting: Emergency Medicine

## 2021-01-04 DIAGNOSIS — W268XXA Contact with other sharp object(s), not elsewhere classified, initial encounter: Secondary | ICD-10-CM | POA: Insufficient documentation

## 2021-01-04 DIAGNOSIS — S91331A Puncture wound without foreign body, right foot, initial encounter: Secondary | ICD-10-CM | POA: Insufficient documentation

## 2021-01-04 DIAGNOSIS — Y92481 Parking lot as the place of occurrence of the external cause: Secondary | ICD-10-CM | POA: Diagnosis not present

## 2021-01-04 DIAGNOSIS — S99921A Unspecified injury of right foot, initial encounter: Secondary | ICD-10-CM | POA: Diagnosis present

## 2021-01-04 NOTE — ED Triage Notes (Signed)
Patient stepped on a sharp object at the parking lot reports mild pain with small puncture wound at plantar right foot this evening .

## 2021-01-05 ENCOUNTER — Emergency Department (HOSPITAL_COMMUNITY): Payer: Medicaid Other

## 2021-01-05 NOTE — ED Notes (Signed)
Pt called for vitals, no response. 

## 2024-01-30 NOTE — Progress Notes (Signed)
 ------------------------------------------------------------------------------- Attestation signed by Dillon, Kathryn Ignatius, MD at 01/30/24 1422 New tdc working well blood cultures negative now  Final abx recs: IV cefepime 2 g q HD, total 7 days from source control (7/27). End date 02/05/24  -------------------------------------------------------------------------------                                      [place]  Nephrology Progress Note   Admission Date:  01/26/2024      Renal Recommendations Final abx recs: IV cefepime 2 g q HD, total 7 days from source control (7/27). End date 02/05/24. Orders called to Bernida, RN at [facility] on 01/30/24.     Assessment & Plan   Principal Problem:   Bacteremia associated with intravascular line Active Problems:   Hyperlipidemia   Coronary artery disease   Hypertension   Type 2 diabetes mellitus with diabetic polyneuropathy, with long-term current use of insulin       ESRD on iHD  MWF at [facility] unit via [facility] EDW: TBD Last OP HD 7/25  Short HD 7/26 and then IR remove TDC for line holiday TDC removed 7/27, on line holiday Plan for [facility] 7/29, then HD after Stable from renal standpoint after HD 7/29    Last 5 Recorded Weights   01/26/24 1815 01/27/24 0355  Weight: 88.8 kg (195 lb 12.3 oz) 88.9 kg (195 lb 15.8 oz)   Hypokalemia - resolved Replete K+ PO 7/26 3K 2.5 Ca bath  Suspected gram-negative bacteremia  1 out of 2 BCx OSH positive for gram-[facility] rods [facility]   Grew E Coli ROS + fevers/chills BCx 7/25 negative x 72 hours  On IV Ceftriaxone  Replace [facility] 7/29 am ID following Echo deferred since Gram negative bacteremia.  Final abx recs: IV cefepime 2 g q HD, total 7 days from source control (7/27). End date 02/05/24. Orders called to Bernida, RN at [facility].   Hypertension Continue home medications Challenge UF as able  Secondary Hyperparathyroidism  Ca stable Phos in range; low.  No binders  needed.  Anemia of CKD (Hgb Goal 10-11) HGB below range. ESA with HD, increased 7/28 No iron with infection.    Current HD Prescription:   Hemodialysis Weights Pre-Treatment Weight (kg): 87.6 kg (193 lb 2 oz) (01/27/24) Estimated Dry Weight (kg): (not recorded) Patient Goal Weight (kg): (not recorded) Total Goal Weight (kg): (not recorded) Post-Treatment Weight (kg): 85.6 kg (188 lb 11.4 oz) (01/27/24) Treatment Weight Change (kg): -2 kg (01/27/24)  Dialyzer: F-180 (98 mLs) (01/27/24)   Bath: 3 K+ / 2.5 Ca+ (01/27/24)  Blood Flow Rate (mL/min): 375 mL/min (01/27/24)  Dialysate Flow Rate (mL/min): 1.5 ml/min (01/27/24)  Total Ultrafiltrate Output (mL): 2500 mL (01/27/24)  Hemodialysis Treatment Total Minutes : 213 (01/27/24)    Subjective   Chief Complaint: Gram-negative bacteremia   HPI:  63 y.o.  female with medical history significant for hypertension, hyperlipidemia, coronary artery disease, diabetes mellitus, end-stage renal disease on dialysis who presents to [facility] with concerns for gram-negative bacteremia, [facility] but 1/2 blood culture bottles revealed gram-negative rods.  Patient received IV antibiotics after blood culture was drawn per patient.   Interval history:  Seen in room.  No new symptoms to report.  Going for new [facility] today then HD after.  No f/c.  She is feeling well.     All positive and pertinent negatives  are noted in the subjective; otherwise all other systems are negative.  Objective   Vital signs in last 24 hours:  Temp:  [36.3 C (97.3 F)-36.4 C (97.5 F)] 36.4 C (97.5 F) Pulse:  [65-78] 65 Resp:  [11-18] 12 BP: (138-162)/(70-97) 150/83 SpO2:  [95 %-100 %] 97 % Intake/Output last 3 shifts: I/O last 3 completed shifts: In: 960 [P.O.:960] Out: 0  Intake/Output this shift: No intake/output data recorded.   Physical Exam: General: Resting comfortably in NAD Pulmonary: Clear  to auscultation  CV: RRR, No rubs, murmurs or gallops Abd: Soft, nontender, nondistended, NABS Extremities: No clubbing, cyanosis, edema.  Access:  Skin: no rashes, lesions, ecchymosis noted Neuro: affect normal, alert and oriented x 3   Scheduled Medications[1]   Test Results  Recent Labs    01/28/24 0425  WBC 7.8  HGB 8.4*  HCT 26.4*  PLT 73*   Recent Labs    01/28/24 0425  NA 140  K 3.8  CL 101  CO2 27.0  BUN 19  CREATININE 3.71*  CALCIUM  9.6  ALBUMIN  3.2*  PHOS 2.7    Blood 01/26/2024   Urine 01/26/2024         [1]  amlodipine   10 mg Oral Daily   aspirin   81 mg Oral Daily   atorvastatin  40 mg Oral Nightly   carvedilol   25 mg Oral BID   cefTRIAXone   2 g Intravenous Q24H   cinacalcet   30 mg Oral Mon,Wed,Fri   citalopram  20 mg Oral Daily   cloNIDine  HCL  0.1 mg Oral BID   divalproex  500 mg Oral At bedtime   DULoxetine  60 mg Oral Nightly   fluticasone furoate-vilanterol  1 puff Inhalation Daily (RT)   folic acid  1 mg Oral Daily   heparin  (porcine) for subcutaneous use  5,000 Units Subcutaneous Q8H [facility]   insulin  glargine  12 Units Subcutaneous Daily   insulin  lispro  0-20 Units Subcutaneous ACHS   lactulose  20 g Oral Daily   letrozole  2.5 mg Oral Daily   losartan  100 mg Oral Daily   pantoprazole  20 mg Oral Daily

## 2024-07-08 NOTE — ED Provider Notes (Signed)
 Emergency Department Provider Note   History   Chief Complaint  Patient presents with   Shortness of Breath      History provided by:  Patient Shortness of Breath Severity:  Severe Onset quality:  Sudden Duration:  1 day Timing:  Constant Progression:  Unchanged Chronicity:  New Relieved by:  None tried Worsened by:  Nothing Ineffective treatments:  None tried Associated symptoms: wheezing   Associated symptoms: no chest pain, no cough and no fever      Past Medical History Medical History[1]   Past Surgical History Surgical History[2]    Medications Current Outpatient Medications  Medication Instructions   albuterol  HFA (PROVENTIL  HFA;VENTOLIN  HFA;PROAIR  HFA) 90 mcg/actuation inhaler 2 puffs, inhalation, Every 6 hours PRN   alcohol swabs padm Use 1 swab as needed to clean site for blood sugar check.   amLODIPine  (NORVASC ) 5 mg, oral, Daily   aspirin  81 mg, Daily   atorvastatin (LIPITOR) 20 mg, oral, Daily   B complex-vitamin C-folic acid (Nephro-Vite) 0.8 mg tab tablet 1 tablet, oral, Daily   calcium  acetate (PHOSLO ) 1,334 mg, oral, 3 times daily with meals   carvedilol  (COREG ) 25 mg, oral, 2 times daily   cetirizine (ZYRTEC) 10 mg, Daily PRN   cholecalciferol (VITAMIN D3) 50,000 Units, oral, Weekly   cilostazoL (PLETAL) 50 mg, 2 times daily   cyanocobalamin (VITAMIN B12) 100 mcg, oral, Daily   ezetimibe (ZETIA) 10 mg, Daily   ferrous sulfate 325 mg (65 mg iron) tablet 1 tablet, 3 times daily   fluticasone furoate-vilanteroL (BREO ELLIPTA) 100-25 mcg/dose dsdv inhaler 1 puff, inhalation, Daily   glucose blood (Precision Q-I-D Test) test strip Use 1 strip to check blood sugar 3times a day. Please dispense based on availability, insurance coverage, and patient preference.   insulin  syr/ndl U100 half mark (BD Veo Insulin  Syr, half unit,) 0.3 mL 31 gauge x 15/64 syrg 1 Syringe, subcutaneous, 3 times daily   Lancets misc Use 1 lancet to lance  skin to check blood sugar 3 times daily.  Please dispense based on availability, insurance coverage, and patient preference.   Lantus U-100 Insulin  20 Units, subcutaneous, Every evening   losartan (COZAAR) 25 mg, Daily   polysaccharide iron complex (NU-IRON) 150 mg, oral, Daily   sertraline (ZOLOFT) 25 mg, Daily   sodium bicarbonate 650 mg, oral, 3 times daily      Allergies Allergies[3]   Family History Family History[4]   Social History Social History   Socioeconomic History   Marital status: Single    Spouse name: Not on file   Number of children: Not on file   Years of education: Not on file   Highest education level: Not on file  Occupational History   Not on file  Tobacco Use   Smoking status: Every Day    Current packs/day: 0.25    Types: Cigarettes   Smokeless tobacco: Never  Substance and Sexual Activity   Alcohol use: No   Drug use: Unknown    Frequency: 7.0 times per week    Types: Crack cocaine    Comment: Drug use: Drug Use: Yes; 2 G Crack Cocaine use daily   Sexual activity: Not on file  Other Topics Concern   Not on file  Social History Narrative   Not on file   Social Drivers of Health   Living Situation: Not on file  Food Insecurity: Not on file  Transportation Needs: Not on file  Utilities: Not on file  Safety: Not on file  Alcohol Screening: Not on file  Tobacco Use: Not on file  Depression: Not on file  Social Connections: Not on file  Financial Resource Strain: Not on file      Review of Systems  Review of Systems  Constitutional:  Negative for fever.  Respiratory:  Positive for shortness of breath and wheezing. Negative for cough.   Cardiovascular:  Negative for chest pain.     Physical Exam   ED Triage Vitals [07/08/24 0206]  Temp 98.6 F (37 C)  Heart Rate 98  Resp (!) 25  BP (!) 220/102  MAP (mmHg) 134  SpO2 91 %  O2 Device Nasal cannula  O2 Flow Rate (L/min) 5 L/min  Weight 102 kg (224 lb  3.3 oz)    Physical Exam Constitutional:      General: She is in acute distress.     Appearance: Normal appearance. She is ill-appearing.  HENT:     Head: Atraumatic.     Mouth/Throat:     Mouth: Mucous membranes are moist.  Eyes:     Extraocular Movements: Extraocular movements intact.  Cardiovascular:     Rate and Rhythm: Normal rate and regular rhythm.     Pulses: Normal pulses.     Heart sounds: No murmur heard. Pulmonary:     Effort: Tachypnea, accessory muscle usage and respiratory distress present.     Breath sounds: No stridor. Wheezing present. No rhonchi or rales.  Abdominal:     General: Abdomen is flat. There is no distension.  Musculoskeletal:        General: No deformity.     Cervical back: Normal range of motion.     Right lower leg: No edema.     Left lower leg: No edema.  Skin:    General: Skin is warm and dry.  Neurological:     Mental Status: She is alert and oriented to person, place, and time. Mental status is at baseline.  Psychiatric:        Mood and Affect: Mood normal.     Labs   Abnormal Labs Reviewed  COMPREHENSIVE METABOLIC PANEL - Abnormal; Notable for the following components:      Result Value   Potassium 5.4 (*)    Chloride 97 (*)    Anion Gap 23 (*)    Glucose, Random 254 (*)    Blood Urea Nitrogen (BUN) 100 (*)    Creatinine 11.80 (*)    eGFR 3 (*)    BUN/Creatinine Ratio 8.5 (*)    All other components within normal limits  TROPONIN, HIGH SENSITIVE X 2 (0 HOUR BASELINE + 2 HOUR) - Abnormal; Notable for the following components:   Troponin, High Sensitive 32 (*)    All other components within normal limits  CBC WITH DIFFERENTIAL - Abnormal; Notable for the following components:   WBC 12.00 (*)    RBC 3.69 (*)    Hemoglobin 9.9 (*)    Hematocrit 31.3 (*)    Mean Corpuscular Hemoglobin (MCH) 26.8 (*)    Mean Corpuscular Hemoglobin Conc (MCHC) 31.5 (*)    Red Cell Distribution Width (RDW) 19.8 (*)    Neutrophils Absolute 9.10  (*)    Monocytes # 1.00 (*)    All other components within normal limits  BLOOD GAS, VENOUS - Abnormal; Notable for the following components:   Base Deficit (-) / Base Excess -2.4 (*)    O2 Saturation, Venous (measured) 66.2 (*)    All other components within normal limits  TROPONIN, HIGH SENSITIVE, 2HR RFX - Abnormal; Notable for the following components:   Troponin, High Sensitive 39 (*)    All other components within normal limits  PROCALCITONIN - Abnormal; Notable for the following components:   Procalcitonin 2.01 (*)    All other components within normal limits    Labs independently reviewed by myself and considered in medical decision making.  Radiology   XR Chest 1 View  Final Result by Oneil LITTIE Engman, MD (01/05 9580)  Addendum (preliminary) 1 of 1 by Oneil LITTIE Engman, MD (01/05 0419)  Date of Service: 2024-07-08 02:04:00    -----ADDENDUM-----:    Receipt of this report by the clinical staff was confirmed with Norleen Towana HEATH, MD by Rexine Motto on Jul 08, 2024 04:18:00 EST.    Electronically signed by: Rexine Motto on 07/08/2024 04:19:04 AM   [facility] [place]    -----ORIGINAL REPORT-----:    Exam:  Chest, portable    History:  Shortness of breath.    Comparisons:  No comparison study available.    Findings:    1.  Bilateral pulmonary infiltrates, right lung greater than left.    2.  No pneumothorax or pleural effusion.    IMPRESSION:    As above.  Consider short-term follow-up vs CT correlation if clinically   indicated.    Electronically signed by: Oneil Engman, MD on 07/08/2024 04:15:59 AM   US [place]    Final  Date of Service: 2024-07-08 02:04:00    Exam:  Chest, portable    History:  Shortness of breath.    Comparisons:  No comparison study available.    Findings:    1.  Bilateral pulmonary infiltrates, right lung greater than left.    2.  No pneumothorax or pleural effusion.    IMPRESSION:    As above.  Consider short-term  follow-up vs CT correlation if clinically   indicated.    Electronically signed by: Oneil Engman, MD on 07/08/2024 04:15:59 AM   US [place]      Imaging independently reviewed by myself and considered in medical decision making. Imaging final read interpreted by radiology.  Procedure Note  Critical Care  Performed by: Norleen Ozell Towana HEATH, MD Authorized by: Norleen Ozell Towana HEATH, MD   Critical care provider statement:    Critical care time (minutes):  35   Critical care time was exclusive of:  Separately billable procedures and treating other patients   Critical care was necessary to treat or prevent imminent or life-threatening deterioration of the following conditions:  Respiratory failure   Critical care was time spent personally by me on the following activities:  Development of treatment plan with patient or surrogate, discussions with consultants, evaluation of patient's response to treatment, examination of patient, obtaining history from patient or surrogate, pulse oximetry, re-evaluation of patient's condition, review of old charts, ordering and performing treatments and interventions, ordering and review of laboratory studies and ordering and review of radiographic studies   Care discussed with: admitting provider     Medical Decision Making   Medical Decision Making Patient is a very pleasant 64 year old female who presents for evaluation of shortness of breath.  She has a history of ESRD and typically has been on Monday, Wednesday, Friday dialysis.  Her last dialysis session was on Friday.  She began having shortness of breath earlier this evening.  EMS reports she was wheezing and they gave DuoNebs.  On arrival here, she is tachypneic, diffuse inspiratory and expiratory wheezing.  She also  has some peripheral edema.  She states they are in the process of changing her dialysis schedule to Tuesday, Thursday, and Saturday.  She denies any chest pain, fever, or other  infectious symptoms.  Differential diagnosis includes COPD exacerbation, volume overload, pneumonia, pneumothorax.  Patient was initially treated with nebs, steroids for suspected COPD exacerbation.  Labs, chest x-ray obtained.  Subjectively she states she is feeling much better however she remains tachypneic and hypertensive.  Due to my concern for a degree of volume overload, I will give her a trial of BiPAP and nitro infusion.  Patient has continued to improve clinically.  Her labs are notable for mild hyperkalemia.  She was given Lokelma .  Her BUN is elevated at 100 but her bicarb is normal.  Chest x-ray shows bilateral infiltrates, right greater than left.  I suspect this is primarily due to pulmonary edema, however due to the possibility of superimposed infection, I have ordered antibiotics, blood cultures, and lactate.  Her lactate is normal.  Patient is anuric and appears to be volume overloaded so no fluid bolus indicated at this time.  Hospitalist consulted for admission.  We also discussed the case with ICU but I believe patient can safely be admitted to the [facility] at this time.  Nephrology has been consulted for dialysis.  Problems Addressed: Acute respiratory failure with hypoxia    (CMD): complicated acute illness or injury Hyperkalemia: complicated acute illness or injury Hypertension, unspecified type: complicated acute illness or injury  Amount and/or Complexity of Data Reviewed Labs: ordered. Decision-making details documented in ED Course. Radiology: ordered and independent interpretation performed. Decision-making details documented in ED Course. ECG/medicine tests: ordered.  Risk Prescription drug management. Decision regarding hospitalization.     ED Clinical Impression   1. Acute respiratory failure with hypoxia    (CMD)   2. Hyperkalemia   3. Hypertension, unspecified type      ED Disposition     ED Disposition  Admit   Condition  --   Comment  Primary  Provider Group: Yes [1]  Provider Group:: [facility] Hospitalist Team [facility]) [[id number]]  Certification: I certify that inpatient services are medically necessary for this patient for a duration of greater than two midnights. See H&P and MD Progress Notes for additional information about the patient's course of treatment.            New Prescriptions   No medications on file      FOLLOW UP No follow-up provider specified.       [1] Past Medical History: Diagnosis Date   Allergy    Ambulates with cane    Anxiety    At high risk for falls    CKD (chronic kidney disease) stage 4, GFR 15-29 ml/min    (CMD) 08/24/2020   COPD (chronic obstructive pulmonary disease)    (CMD)    no O2   Coronary artery disease 08/02/2013   Crack cocaine use    Depression    Diabetes mellitus    (CMD)    Hypertension    Major depressive disorder    Neuropathy    Shingles 07/29/2019   TIA (transient ischemic attack)    Type 2 diabetes mellitus with diabetic polyneuropathy, with long-term current use of insulin     (CMD) 08/02/2013  [2] Past Surgical History: Procedure Laterality Date   INCISION AND DRAINAGE FOOT Right 02/24/2021   Procedure: INCISION AND DRAINAGE FOOT;  Surgeon: Norleen Deward Leyland, DPM;  Location: [facility] MAIN OR;  Service: Podiatry;  Laterality: Right;   NO PAST SURGERIES     Procedure: NO PAST SURGERIES  [3] Allergies Allergen Reactions   Lisinopril Cough  [4] Family History Problem Relation Name Age of Onset   Hypertension Mother     Diabetes Father     Diabetes Brother     Hypertension Brother     Hypertension Maternal Aunt     Cancer Maternal Grandmother     Heart disease Maternal Grandfather

## 2024-07-12 NOTE — Discharge Summary (Signed)
 [place] Hospitalist  Discharge Summary   Name: Kathryn Dillon Age: 64 yrs  MRN: [id number] DOB: Mar 14, 1961  Admit Date: 07/08/2024 Admitting Physician: Marsa Rexann Speaks, MD  Discharge Date: 07/12/2024 Discharge Physician: Lamar Furnish, MD    Discharge Diagnoses:   Principal Problem (Resolved):   Acute hypoxic respiratory failure    (CMD) Active Problems:   COPD with acute exacerbation    (CMD)   ESRD (end stage renal disease) (CMD)   Impaired functional mobility, balance, gait, and endurance   ESRD on dialysis (CMD) Resolved Problems:   Hypertensive emergency   Hypervolemia   TO DO List at Follow-up for PCP/Specialist:   Key Medication changes: Amlodipine  increased to 10 mg daily Clonidine  started  Pending labs to follow up on: CBC,Basic Metabolic Panel (BMP) for electrolytes and renal function   Pending Labs     Order Current Status   Blood Culture, 1st Set Preliminary result   Blood Culture, 2nd Set Preliminary result        Hospital Course:   For full details, please see H&P, progress notes, consult notes and ancillary notes. Briefly, Kathryn Dillon is a 64 y.o. year old female presented with acute shortness of breath, found to be in respiratory distress, wheezy, hypoxic, and severely hypertensive (BP 220/102). CXR revealed pulmonary edema consistent with hypervolemia (7 kg above dry weight of 195 lbs) and increased afterload. Initial management included: Nitroglycerin drip for BP control (later weaned after normotension achieved) Antibiotics: Completed Rocephin  and azithromycin Steroids: Solu-Medrol followed by prednisone taper completed  Lokelma  for hyperkalemia NIV and DuoNebs for respiratory support Hemodialysis performed on 07/12/2024 for 3 hours with UF of 2.5 L Patient stabilized and transferred to acute care bed. Completed prednisone and azithromycin course.  Patient is found stable for discharge today. Cleared by the  subspeciality team for discharge. Patient advised to call 911 or visit nearest ER facility for any worsening or relapse of symptoms. Patient advised to adhere to recommended diet and pharmacological treatment. New medications sent to patient pharmacy electronically. Follow up in subspeciality clinic as per appointment date. Also discussed about the medication compliance as non-compliance increases the risk of permanent disability, morbidity and mortality. Patient verbalized understanding. Patient was provided space to ask questions and all concerns were addressed.  Labs, diagnosis and plan of care was explained to the patient and the patient verbalizes understanding. Counseled detailed on specific diet and exercises, medication effects including side effects, instruction on how to take medication, compliance and patient verbalized understanding and agreed to it.  Proper medical follow up instructed and patient agreed to follow.   Predictive Model Details        25.9% (Medium)  Factor Value   Calculated 07/12/2024 12:05 9% Number of active outpatient medication orders 21   Readmission Risk Score v2 Model 8% Latest hemoglobin in last 72 hrs 7.6 g/dL    8% Number of appointments in last 90 days 0    7% Number of hospitalizations in last year 0    7% Number of ED visits in last 90 days 1    The patient's chronic medical conditions were treated accordingly per the patient's home medication regimen except as noted in the plan above and in the medication list below.    Discharge Condition:   Disposition: Patient discharged to Home or Self Care in fair condition.  Diet at discharge:    Dietary Orders  (From admission, onward)  Ordered    Adult Diet- Renal; Dialysis (K/Phos/Na restricted); Fluid Restricted; 1500 mL Fluid  Diet effective now       References:    Medical Nutrition Management (MNM) for Registered Dietitian  Question Answer Comment  Diet type: Renal   Renal  Restriction: Dialysis (K/Phos/Na restricted)   Fluid Restriction: Fluid Restricted   Fluid restriction dietary / 24h: 1500 mL Fluid   Medical Nutrition Management By RD: Yes, Medical Nutrition Management By RD      07/08/24 0546            Activity at Discharge: Ambulate ad lib   Physical Exam at Discharge   BP 139/61 (BP Location: Right arm, Patient Position: Lying)   Pulse 84   Temp 97.9 F (36.6 C) (Oral)   Resp 18   Ht 1.626 m (5' 4)   Wt 120 kg (264 lb 8.8 oz)   SpO2 99%   BMI 45.41 kg/m  Physical exam on discharge: Vitals: Reviewed Constitutional: Patient is well developed and not in acute distress  HEENT: Head [place] and AT  Eyes: Conjunctiva normal, PERRLA Neck: Supple, No JVD  CVS: Normal rate and rhythm, Nl heart sounds, no M/R/G Lung: Nl effort , Normal breath sounds ,no added sounds  Abdomen : Soft, non tender, non distended ,BS+ MSK: Normal range of motion  Neuro: Alert and oriented to TPP with intact Judgement  Ext: No pedal edema     Discharge Medications:      Medication List     START taking these medications    cloNIDine  0.1 mg tablet Commonly known as: CATAPRES  Take 1 tablet (0.1 mg total) by mouth 2 (two) times a day.       CHANGE how you take these medications    amLODIPine  10 mg tablet Commonly known as: NORVASC  Take 1 tablet (10 mg total) by mouth daily. Start taking on: July 13, 2024 What changed:  medication strength how much to take       CONTINUE taking these medications    albuterol  HFA 90 mcg/actuation inhaler Commonly known as: PROVENTIL  HFA;VENTOLIN  HFA;PROAIR  HFA Inhale 2 puffs every 6 (six) hours as needed for wheezing.   alcohol swabs Padm Use 1 swab as needed to clean site for blood sugar check.   aspirin  81 mg EC tablet Take 81 mg by mouth daily.   atorvastatin 20 mg tablet Commonly known as: LIPITOR Take 20 mg by mouth Once Daily for 30 days.   BD Veo Insulin  Syr (half unit) 0.3 mL 31 gauge x  15/64 Syrg Generic drug: insulin  syr/ndl U100 half mark Inject 1 Syringe under the skin 3 (three) times a day.   carvedilol  25 mg tablet Commonly known as: COREG  Take 25 mg by mouth 2 (two) times a day.   cholecalciferol 1,250 mcg (50,000 unit) capsule Commonly known as: VITAMIN D3 Take 50,000 Units by mouth once a week.   cilostazoL 50 mg tablet Commonly known as: PLETAL Take 50 mg by mouth in the morning and 50 mg in the evening.   cyanocobalamin 100 mcg tablet Commonly known as: VITAMIN B12 Take 100 mcg by mouth Once Daily.   ezetimibe 10 mg tablet Commonly known as: ZETIA Take 10 mg by mouth daily.   ferrous sulfate 325 mg (65 mg iron) tablet Take 1 tablet by mouth 3 (three) times a day.   fluticasone furoate-vilanteroL 100-25 mcg/dose inhaler Commonly known as: BREO ELLIPTA Inhale 1 puff Once Daily.   Lancets Misc Use 1 lancet  to lance skin to check blood sugar 3 times daily.  Please dispense based on availability, insurance coverage, and patient preference.   Lantus U-100 Insulin  100 unit/mL injection Generic drug: insulin  glargine Inject 20 Units under the skin every evening for 90 days.   losartan 25 mg tablet Commonly known as: COZAAR Take 25 mg by mouth daily.   Nephro-Vite 0.8 mg Tab tablet Generic drug: B complex-vitamin C-folic acid Take 1 tablet by mouth Once Daily.   polysaccharide iron complex 150 mg iron capsule Commonly known as: NU-IRON Take 150 mg by mouth Once Daily.   Precision Q-I-D Test test strip Generic drug: glucose blood Use 1 strip to check blood sugar 3times a day. Please dispense based on availability, insurance coverage, and patient preference.   sertraline 25 mg tablet Commonly known as: ZOLOFT Take 25 mg by mouth daily.   sodium bicarbonate 650 mg tablet Take 650 mg by mouth 3 (three) times a day for 90 days.         Where to Get Your Medications     These medications were sent to [id number] - [place],  [place] - [address] AT [address]OSS [address] - PHONE: [phone] - FAX: [phone]  [address], [place] [address]    Phone: [phone]  amLODIPine  10 mg tablet cloNIDine  0.1 mg tablet     Significant Diagnostic Tests:   LABS:  Lab Results  Component Value Date   WBC 7.93 07/10/2024   HGB 7.6 (L) 07/10/2024   HCT 23.5 (L) 07/10/2024   PLT 83 (L) 07/10/2024   ALT 7 07/10/2024   AST 9 (L) 07/10/2024   NA 137 07/10/2024   K 4.7 (H) 07/10/2024   CL 94 (L) 07/10/2024   CREATININE 10.71 (H) 07/10/2024   BUN 115 (H) 07/10/2024   CO2 22 07/10/2024   INR 1.12 03/26/2021   HGBA1C 8.1 (H) 07/08/2024   IMAGING:  XR Chest 1 View  Final Result by Oneil LITTIE Engman, MD (01/05 9580)  Addendum (preliminary) 1 of 1 by Oneil LITTIE Engman, MD (01/05 0419)  Date of Service: 2024-07-08 02:04:00    -----ADDENDUM-----:    Receipt of this report by the clinical staff was confirmed with Norleen Towana HEATH, MD by Rexine Motto on Jul 08, 2024 04:18:00 EST.    Electronically signed by: Rexine Motto on 07/08/2024 04:19:04 AM   US [place]    -----ORIGINAL REPORT-----:    Exam:  Chest, portable    History:  Shortness of breath.    Comparisons:  No comparison study available.    Findings:    1.  Bilateral pulmonary infiltrates, right lung greater than left.    2.  No pneumothorax or pleural effusion.    IMPRESSION:    As above.  Consider short-term follow-up vs CT correlation if clinically   indicated.    Electronically signed by: Oneil Engman, MD on 07/08/2024 04:15:59 AM   US [place]    Final  Date of Service: 2024-07-08 02:04:00    Exam:  Chest, portable    History:  Shortness of breath.    Comparisons:  No comparison study available.    Findings:    1.  Bilateral pulmonary infiltrates, right lung greater than left.    2.  No pneumothorax or pleural effusion.    IMPRESSION:    As above.  Consider short-term follow-up vs CT  correlation if clinically   indicated.    Electronically signed by: Oneil Engman, MD  on 07/08/2024 04:15:59 AM   US [place]ette     CULTURES:  Results for orders placed or performed during the hospital encounter of 07/08/24 (from the past week)  Blood Culture, 1st Set   Specimen: Venous; Blood  Result Value Ref Range   Blood Culture No Growth after 4 days   Blood Culture, 2nd Set   Specimen: Venous; Blood  Result Value Ref Range   Blood Culture No Growth after 4 days      Surgeries/Procedures:      Consults:   IP CONSULT TO HOSPITALIST IP CONSULT TO NEPHROLOGY IP CONSULT TO NEPHROLOGY   Follow-up Appointments:    No future appointments.     Electronically signed by: Lamar Furnish, MD 07/12/2024 4:04 PM Time spent on discharge: 35 minutes.

## 2024-07-12 NOTE — Care Plan (Signed)
  Problem: Health Behavior: Goal: MCB Ability to state ways to decrease the risk of falls will be met by discharge Description: Ability to state ways to decrease the risk of falls will improve by discharge Outcome: Progressing   Problem: Safety: Goal: Will remain free from falls by discharge Description: Will remain free from falls by discharge Outcome: Progressing   Problem: Knowledge Deficit Goal: Patient/family/caregiver demonstrates understanding of disease process, treatment plan, medications, and discharge instructions Description: Complete learning assessment and assess knowledge base. Outcome: Progressing   Problem: Compromised Skin Integrity Goal: Skin integrity is maintained or improved Description: Assess and monitor skin integrity. Identify patients at risk for skin breakdown on admission and per policy. Collaborate with interdisciplinary team and initiate plans and interventions as needed.    Outcome: Progressing Goal: Fluid and electrolyte balance are achieved/maintained Description: Assess and monitor vital signs (orthostatic vitals if applicable), fluid intake and output, urine color, labs, skin turgor, mucous membranes, jugular venous distention, edema, circumference of edematous extremities and abdominal girth, respiratory status, and mental status.  Monitor for signs and symptoms of hypovolemia (tachycardia, rapid breathing, decreased urine output, postural hypotension, confusion, syncope).  Monitor for signs and symptoms of hypervolemia (strong rapid pulse, shortness of breath, difficulty breathing lying down, crackles heard in lung fields, edema). Collaborate with interdisciplinary team and initiate plan and interventions as ordered. Outcome: Progressing Goal: Nutritional status is improving Description: Monitor and assess patient for malnutrition (ex- brittle hair, bruises, dry skin, pale skin and conjunctiva, muscle wasting, smooth red tongue, and disorientation).  Collaborate with interdisciplinary team and initiate plan and interventions as ordered.  Monitor patient's weight and dietary intake as ordered or per policy. Utilize nutrition screening tool and intervene per policy. Determine patient's food preferences and provide high-protein, high-caloric foods as appropriate.  Outcome: Progressing   Problem: Urinary Incontinence Goal: Perineal skin integrity is maintained or improved Description: Assess genitourinary system, perineal skin, labs (urinalysis), and history of incontinence to include past management, aggravating, and alleviating factors.  Collaborate with interdisciplinary team and initiate plans and interventions as needed. Outcome: Progressing

## 2024-07-29 ENCOUNTER — Inpatient Hospital Stay (HOSPITAL_BASED_OUTPATIENT_CLINIC_OR_DEPARTMENT_OTHER)
Admission: EM | Admit: 2024-07-29 | Discharge: 2024-07-31 | DRG: 640 | Disposition: A | Discharge status: Home / Self Care | Payer: Self-pay | Attending: Internal Medicine | Admitting: Internal Medicine

## 2024-07-29 ENCOUNTER — Encounter (HOSPITAL_BASED_OUTPATIENT_CLINIC_OR_DEPARTMENT_OTHER): Payer: Self-pay

## 2024-07-29 ENCOUNTER — Emergency Department (HOSPITAL_BASED_OUTPATIENT_CLINIC_OR_DEPARTMENT_OTHER): Payer: Self-pay

## 2024-07-29 ENCOUNTER — Other Ambulatory Visit: Payer: Self-pay

## 2024-07-29 DIAGNOSIS — Z79899 Other long term (current) drug therapy: Secondary | ICD-10-CM

## 2024-07-29 DIAGNOSIS — F17210 Nicotine dependence, cigarettes, uncomplicated: Secondary | ICD-10-CM | POA: Diagnosis present

## 2024-07-29 DIAGNOSIS — Z7982 Long term (current) use of aspirin: Secondary | ICD-10-CM

## 2024-07-29 DIAGNOSIS — Z91158 Patient's noncompliance with renal dialysis for other reason: Secondary | ICD-10-CM

## 2024-07-29 DIAGNOSIS — Z6837 Body mass index (BMI) 37.0-37.9, adult: Secondary | ICD-10-CM

## 2024-07-29 DIAGNOSIS — Z992 Dependence on renal dialysis: Secondary | ICD-10-CM

## 2024-07-29 DIAGNOSIS — I160 Hypertensive urgency: Secondary | ICD-10-CM | POA: Diagnosis present

## 2024-07-29 DIAGNOSIS — I5033 Acute on chronic diastolic (congestive) heart failure: Secondary | ICD-10-CM | POA: Diagnosis present

## 2024-07-29 DIAGNOSIS — Z9981 Dependence on supplemental oxygen: Secondary | ICD-10-CM

## 2024-07-29 DIAGNOSIS — E66812 Obesity, class 2: Secondary | ICD-10-CM | POA: Diagnosis present

## 2024-07-29 DIAGNOSIS — M898X9 Other specified disorders of bone, unspecified site: Secondary | ICD-10-CM | POA: Diagnosis present

## 2024-07-29 DIAGNOSIS — N186 End stage renal disease: Secondary | ICD-10-CM | POA: Diagnosis present

## 2024-07-29 DIAGNOSIS — R0602 Shortness of breath: Principal | ICD-10-CM

## 2024-07-29 DIAGNOSIS — J9601 Acute respiratory failure with hypoxia: Secondary | ICD-10-CM | POA: Diagnosis present

## 2024-07-29 DIAGNOSIS — E7800 Pure hypercholesterolemia, unspecified: Secondary | ICD-10-CM | POA: Diagnosis present

## 2024-07-29 DIAGNOSIS — J449 Chronic obstructive pulmonary disease, unspecified: Secondary | ICD-10-CM | POA: Diagnosis present

## 2024-07-29 DIAGNOSIS — I132 Hypertensive heart and chronic kidney disease with heart failure and with stage 5 chronic kidney disease, or end stage renal disease: Secondary | ICD-10-CM | POA: Diagnosis present

## 2024-07-29 DIAGNOSIS — Z888 Allergy status to other drugs, medicaments and biological substances status: Secondary | ICD-10-CM

## 2024-07-29 DIAGNOSIS — Z794 Long term (current) use of insulin: Secondary | ICD-10-CM

## 2024-07-29 DIAGNOSIS — D631 Anemia in chronic kidney disease: Secondary | ICD-10-CM | POA: Diagnosis present

## 2024-07-29 DIAGNOSIS — E1122 Type 2 diabetes mellitus with diabetic chronic kidney disease: Secondary | ICD-10-CM | POA: Diagnosis present

## 2024-07-29 DIAGNOSIS — I2489 Other forms of acute ischemic heart disease: Secondary | ICD-10-CM | POA: Diagnosis present

## 2024-07-29 DIAGNOSIS — J9621 Acute and chronic respiratory failure with hypoxia: Secondary | ICD-10-CM | POA: Diagnosis present

## 2024-07-29 DIAGNOSIS — Z1152 Encounter for screening for COVID-19: Secondary | ICD-10-CM

## 2024-07-29 DIAGNOSIS — E1169 Type 2 diabetes mellitus with other specified complication: Secondary | ICD-10-CM

## 2024-07-29 DIAGNOSIS — E1151 Type 2 diabetes mellitus with diabetic peripheral angiopathy without gangrene: Secondary | ICD-10-CM | POA: Diagnosis present

## 2024-07-29 DIAGNOSIS — E875 Hyperkalemia: Principal | ICD-10-CM | POA: Diagnosis present

## 2024-07-29 HISTORY — DX: Heart failure, unspecified: I50.9

## 2024-07-29 HISTORY — DX: Disorder of kidney and ureter, unspecified: N28.9

## 2024-07-29 LAB — CBC WITH DIFFERENTIAL/PLATELET
Abs Immature Granulocytes: 0.03 10*3/uL (ref 0.00–0.07)
Basophils Absolute: 0 10*3/uL (ref 0.0–0.1)
Basophils Relative: 0 %
Eosinophils Absolute: 0.3 10*3/uL (ref 0.0–0.5)
Eosinophils Relative: 4 %
HCT: 27.9 % — ABNORMAL LOW (ref 36.0–46.0)
Hemoglobin: 8.8 g/dL — ABNORMAL LOW (ref 12.0–15.0)
Immature Granulocytes: 0 %
Lymphocytes Relative: 7 %
Lymphs Abs: 0.7 10*3/uL (ref 0.7–4.0)
MCH: 27.1 pg (ref 26.0–34.0)
MCHC: 31.5 g/dL (ref 30.0–36.0)
MCV: 85.8 fL (ref 80.0–100.0)
Monocytes Absolute: 0.5 10*3/uL (ref 0.1–1.0)
Monocytes Relative: 6 %
Neutro Abs: 7.5 10*3/uL (ref 1.7–7.7)
Neutrophils Relative %: 83 %
Platelets: 116 10*3/uL — ABNORMAL LOW (ref 150–400)
RBC: 3.25 MIL/uL — ABNORMAL LOW (ref 3.87–5.11)
RDW: 17.2 % — ABNORMAL HIGH (ref 11.5–15.5)
WBC: 9.1 10*3/uL (ref 4.0–10.5)
nRBC: 0 % (ref 0.0–0.2)

## 2024-07-29 LAB — COMPREHENSIVE METABOLIC PANEL WITH GFR
ALT: 8 U/L (ref 0–44)
AST: 15 U/L (ref 15–41)
Albumin: 4.4 g/dL (ref 3.5–5.0)
Alkaline Phosphatase: 123 U/L (ref 38–126)
Anion gap: 20 — ABNORMAL HIGH (ref 5–15)
BUN: 87 mg/dL — ABNORMAL HIGH (ref 8–23)
CO2: 22 mmol/L (ref 22–32)
Calcium: 9.8 mg/dL (ref 8.9–10.3)
Chloride: 99 mmol/L (ref 98–111)
Creatinine, Ser: 12 mg/dL — ABNORMAL HIGH (ref 0.44–1.00)
GFR, Estimated: 3 mL/min — ABNORMAL LOW
Glucose, Bld: 138 mg/dL — ABNORMAL HIGH (ref 70–99)
Potassium: 5.9 mmol/L — ABNORMAL HIGH (ref 3.5–5.1)
Sodium: 141 mmol/L (ref 135–145)
Total Bilirubin: 0.5 mg/dL (ref 0.0–1.2)
Total Protein: 7.4 g/dL (ref 6.5–8.1)

## 2024-07-29 LAB — HEMOGLOBIN A1C
Hgb A1c MFr Bld: 7.9 % — ABNORMAL HIGH (ref 4.8–5.6)
Mean Plasma Glucose: 180.03 mg/dL

## 2024-07-29 LAB — LIPASE, BLOOD: Lipase: 72 U/L — ABNORMAL HIGH (ref 11–51)

## 2024-07-29 LAB — RESP PANEL BY RT-PCR (RSV, FLU A&B, COVID)  RVPGX2
Influenza A by PCR: NEGATIVE
Influenza B by PCR: NEGATIVE
Resp Syncytial Virus by PCR: NEGATIVE
SARS Coronavirus 2 by RT PCR: NEGATIVE

## 2024-07-29 LAB — PRO BRAIN NATRIURETIC PEPTIDE: Pro Brain Natriuretic Peptide: 16089 pg/mL — ABNORMAL HIGH

## 2024-07-29 LAB — GLUCOSE, CAPILLARY: Glucose-Capillary: 164 mg/dL — ABNORMAL HIGH (ref 70–99)

## 2024-07-29 LAB — HEPATITIS B SURFACE ANTIGEN: Hepatitis B Surface Ag: NONREACTIVE

## 2024-07-29 LAB — TROPONIN T, HIGH SENSITIVITY: Troponin T High Sensitivity: 116 ng/L (ref 0–19)

## 2024-07-29 MED ORDER — AMLODIPINE BESYLATE 10 MG PO TABS
10.0000 mg | ORAL_TABLET | Freq: Every day | ORAL | Status: DC
  Administered 2024-07-30: 10 mg via ORAL
  Filled 2024-07-29: qty 1

## 2024-07-29 MED ORDER — SODIUM ZIRCONIUM CYCLOSILICATE 10 G PO PACK
10.0000 g | PACK | Freq: Once | ORAL | Status: AC
  Administered 2024-07-29: 10 g via ORAL
  Filled 2024-07-29: qty 1

## 2024-07-29 MED ORDER — INSULIN ASPART 100 UNIT/ML IV SOLN
5.0000 [IU] | Freq: Once | INTRAVENOUS | Status: AC
  Administered 2024-07-29: 5 [IU] via INTRAVENOUS
  Filled 2024-07-29: qty 5

## 2024-07-29 MED ORDER — SACCHAROMYCES BOULARDII 250 MG PO CAPS
250.0000 mg | ORAL_CAPSULE | Freq: Two times a day (BID) | ORAL | Status: DC
  Administered 2024-07-30 (×2): 250 mg via ORAL
  Filled 2024-07-29 (×5): qty 1

## 2024-07-29 MED ORDER — DEXTROSE 50 % IV SOLN
1.0000 | Freq: Once | INTRAVENOUS | Status: AC
  Administered 2024-07-29: 50 mL via INTRAVENOUS
  Filled 2024-07-29: qty 50

## 2024-07-29 MED ORDER — INSULIN ASPART PROT & ASPART (70-30 MIX) 100 UNIT/ML ~~LOC~~ SUSP
50.0000 [IU] | Freq: Two times a day (BID) | SUBCUTANEOUS | Status: DC
  Filled 2024-07-29: qty 10

## 2024-07-29 MED ORDER — HYDROCODONE-ACETAMINOPHEN 5-325 MG PO TABS: 1.0000 | ORAL_TABLET | Freq: Four times a day (QID) | ORAL | Status: DC | PRN

## 2024-07-29 MED ORDER — ALBUTEROL SULFATE (2.5 MG/3ML) 0.083% IN NEBU: 2.5000 mg | INHALATION_SOLUTION | Freq: Four times a day (QID) | RESPIRATORY_TRACT | Status: DC | PRN

## 2024-07-29 MED ORDER — ISOSORBIDE MONONITRATE ER 30 MG PO TB24
30.0000 mg | ORAL_TABLET | Freq: Every day | ORAL | Status: DC
  Administered 2024-07-30: 30 mg via ORAL
  Filled 2024-07-29 (×2): qty 1

## 2024-07-29 MED ORDER — ONDANSETRON HCL 4 MG/2ML IJ SOLN
4.0000 mg | Freq: Once | INTRAMUSCULAR | Status: AC
  Administered 2024-07-29: 4 mg via INTRAVENOUS
  Filled 2024-07-29: qty 2

## 2024-07-29 MED ORDER — INSULIN ASPART 100 UNIT/ML IJ SOLN: 0.0000 [IU] | INTRAMUSCULAR | Status: DC

## 2024-07-29 MED ORDER — CHLORHEXIDINE GLUCONATE CLOTH 2 % EX PADS
6.0000 | MEDICATED_PAD | Freq: Every day | CUTANEOUS | Status: DC
  Administered 2024-07-30 – 2024-07-31 (×2): 6 via TOPICAL

## 2024-07-29 MED ORDER — HYDRALAZINE HCL 20 MG/ML IJ SOLN
10.0000 mg | Freq: Four times a day (QID) | INTRAMUSCULAR | Status: DC | PRN
  Administered 2024-07-30: 10 mg via INTRAVENOUS
  Filled 2024-07-29: qty 1

## 2024-07-29 MED ORDER — ASPIRIN 81 MG PO TBEC
81.0000 mg | DELAYED_RELEASE_TABLET | Freq: Every day | ORAL | Status: DC
  Administered 2024-07-30: 81 mg via ORAL
  Filled 2024-07-29: qty 1

## 2024-07-29 MED ORDER — CALCIUM ACETATE (PHOS BINDER) 667 MG PO CAPS
1334.0000 mg | ORAL_CAPSULE | Freq: Three times a day (TID) | ORAL | Status: DC
  Administered 2024-07-30 (×3): 1334 mg via ORAL
  Filled 2024-07-29 (×3): qty 2

## 2024-07-29 MED ORDER — CLONIDINE HCL 0.1 MG PO TABS: 0.1000 mg | ORAL_TABLET | Freq: Four times a day (QID) | ORAL | Status: DC | PRN

## 2024-07-29 MED ORDER — CALCIUM GLUCONATE-NACL 1-0.675 GM/50ML-% IV SOLN
1.0000 g | Freq: Once | INTRAVENOUS | Status: AC
  Administered 2024-07-29: 1000 mg via INTRAVENOUS
  Filled 2024-07-29: qty 50

## 2024-07-29 NOTE — Consult Note (Signed)
 [facility] Renal Consultation Note  Requesting MD: Lavada Stank, MD Indication for Consultation:  ESRD  Chief complaint: shortness of breath  HPI:  Kathryn Dillon is a 64 y.o. female with a history of ESRD on hemodialysis (MWF HD in [place] on [place]), CHF, hypertension, and COPD who presented to the hospital with shortness of breath.  She reported that her ride did not show up today which caused her to miss dialysis.  She states that she is usually on 3 to 4 L of oxygen and was on 5 to 6 L of oxygen per report.  She has had nausea with vomiting today.  She states that her breathing is now all right.  She denies any sick contacts.  She has had some cough.  She had presented to [facility] ER and then was transferred to [facility] for further management for urgent dialysis.  She had hyperkalemia and received lokelma  as well.  Her dialysis orders were placed earlier today by Dr. Melia.  He had requested that she be a priority treatment.  Nephrology is consulted for assistance with management of hemodialysis.     PMHx:   Past Medical History:  Diagnosis Date   CHF (congestive heart failure) (HCC)    COPD (chronic obstructive pulmonary disease) with chronic bronchitis (HCC)    Diabetes mellitus without complication (HCC)    H/O drug abuse (HCC)    H/O ETOH abuse    High cholesterol    Hypertension    PAD (peripheral artery disease)    Renal disorder    Family Hx:  she denies any family history of ESRD   Social History:  reports that she has been smoking cigarettes. She has never used smokeless tobacco. She reports that she does not drink alcohol and does not use drugs.  Allergies: Allergies[1] She has a cough with lisinopril   Medications: Prior to Admission medications  Medication Sig Start Date End Date Taking? Authorizing Provider  aspirin  EC 81 MG EC tablet Take 1 tablet (81 mg total) by mouth daily. 03/26/17  Yes Gherghe, Costin M, MD   HYDROcodone -acetaminophen  (NORCO/VICODIN) 5-325 MG tablet Take 1 tablet by mouth every 6 (six) hours as needed for severe pain. 03/26/17  Yes Trixie Nilda HERO, MD  insulin  NPH-regular Human (HUMULIN  70/30) (70-30) 100 UNIT/ML injection Inject 80 Units into the skin 2 (two) times daily with a meal.   Yes [provider]  OXYGEN Inhale into the lungs as needed. 2L [place] PRN   Yes [provider]  saccharomyces boulardii (FLORASTOR) 250 MG capsule Take 1 capsule (250 mg total) by mouth 2 (two) times daily. 03/26/17  Yes Trixie Nilda HERO, MD   I have reviewed the patient's current and reported prior to admission medications.  Med rec is not yet complete.  The patient does tell me that she takes 2 blue and white binders (phoslo ) each time that she eats-meal or snack   Labs:     Latest Ref Rng & Units 07/29/2024    2:15 PM 12/28/2020    7:25 PM 03/25/2017    4:19 AM  BMP  Glucose 70 - 99 mg/dL 861  870  883   BUN 8 - 23 mg/dL 87  96  14   Creatinine 0.44 - 1.00 mg/dL 87.99  2.42  8.88   Sodium 135 - 145 mmol/L 141  137  137   Potassium 3.5 - 5.1 mmol/L 5.9  5.6  4.5   Chloride 98 - 111  mmol/L 99  109  107   CO2 22 - 32 mmol/L 22  20  23    Calcium  8.9 - 10.3 mg/dL 9.8  8.2  8.3      ROS:  Pertinent items noted in HPI and remainder of comprehensive ROS otherwise negative. She states that her tooth has been loose since the beginning of December  Physical Exam: Vitals:   07/29/24 1800 07/29/24 1832  BP: (!) 217/100   Pulse: 99   Resp: 20   Temp:  99.4 F (37.4 C)  SpO2: 98%      General: Elderly female in bed in no acute distress at rest HEENT: Normocephalic.  She has a top front tooth which is visibly loose and she appears to move this with her tongue (RN and I asked her not to wiggle this with her tongue) Eyes: EOMI sclera anicteric Neck: Supple trachea midline Heart: S1-S2 no rub Lungs: Occasional coarse breath sounds slight increased work of breathing with  exertion.  On 4 L of oxygen Abdomen: soft/nt/obese habitus Extremities: no edema appreciated; no cyanosis or clubbing Skin: No rash on extremities exposed Neuro: Alert and conversant.  Provides a history and follows commands. Psych no anxiety or agitation Access left arm graft with bruit  Assessment/Plan:   # Acute on chronic hypoxic respiratory failure - Secondary to fluid overload.  She missed dialysis today after her ride did not show up.  As she was already coming off of a weekend (2 day break), she had no reserve - Optimize volume status with HD  # End-stage renal disease - HD today per MWF schedule.  I have called the dialysis unit to let them know that she is here and they are getting her shortly - Optimize volume status with HD - She is followed by an outside practice.  Will need to call to get her dialysis orders tomorrow  # Hypertension - Optimize volume status with HD - Team notes that med rec was partially completed - would obtain full med rec when able  - resume home regimen per team   # Hyperkalemia - s/p lokelma  and for HD tonight  - renal panel in AM - She was currently ordered to be NPO.  Given her visibly loose tooth may be best to use mechanical soft once food is resumed-defer to primary team.  Low potassium diet please as well and may need scheduled lokelma  on non-HD days while the tooth issue is ongoing.     # Anemia of CKD - No acute indication for PRBC's - Will need to get her outpatient rx tomorrow      # Metabolic bone disease - Will need to get her outpatient rx tomorrow     - Med rec may not be complete - no binder is listed - renal panel in AM  Thank you for the consult.  Please do not hesitate to contact me with any questions regarding our patient   Katheryn JAYSON Saba 07/29/2024, 8:10 PM         [1]  Allergies Allergen Reactions   Lisinopril Cough

## 2024-07-29 NOTE — ED Notes (Signed)
 Pt states she doesn't pee at all for a year

## 2024-07-29 NOTE — ED Triage Notes (Signed)
 Reports missing dialysis appt today. NV, SHOB, chest pain since this morning. O2 sat 87 RA in triage

## 2024-07-29 NOTE — Progress Notes (Signed)
" ° °  Patient was seen on arrival to [facility].  Notes and orders were reviewed and case was discussed briefly with RN at bedside.  Appreciate nephrology arranging urgent dialysis.  Anticipate improvement in her blood pressure, hyperkalemia, and respiratory symptoms with HD. "

## 2024-07-29 NOTE — ED Provider Notes (Signed)
 " [facility] Provider Note   CSN: [id number] Arrival date & time: 07/29/24  1330     Patient presents with: Shortness of Breath and Emesis   Kathryn Dillon is a 64 y.o. female.   Patient is a 64 year old female who presents to ED for increasing shortness of breath and nausea/vomiting that began last evening. Patient notes multiple episodes of vomiting overnight and continued nausea. States she is on dialysis MWF and missed this morning as her ride did not show up. She did call but no one was at clinic today. Feels like her shob is from missing dialysis. Notes last dialysis done Friday. She is on 3L O2 via [place] at baseline, no increases. Endorses continued nausea. Denies sick contacts, fevers, chills, chest pain, abdominal pain, diarrhea/constipation. Does not make urine at baseline.    Shortness of Breath Associated symptoms: vomiting   Associated symptoms: no abdominal pain, no chest pain, no fever and no headaches   Emesis Associated symptoms: no abdominal pain, no chills, no diarrhea, no fever and no headaches        Prior to Admission medications  Medication Sig Start Date End Date Taking? Authorizing Provider  aspirin  EC 81 MG EC tablet Take 1 tablet (81 mg total) by mouth daily. 03/26/17  Yes Gherghe, Costin M, MD  HYDROcodone -acetaminophen  (NORCO/VICODIN) 5-325 MG tablet Take 1 tablet by mouth every 6 (six) hours as needed for severe pain. 03/26/17  Yes Trixie Nilda HERO, MD  insulin  NPH-regular Human (HUMULIN  70/30) (70-30) 100 UNIT/ML injection Inject 80 Units into the skin 2 (two) times daily with a meal.   Yes [provider]  OXYGEN Inhale into the lungs as needed. 2L [place] PRN   Yes [provider]  saccharomyces boulardii (FLORASTOR) 250 MG capsule Take 1 capsule (250 mg total) by mouth 2 (two) times daily. 03/26/17  Yes Gherghe, Costin M, MD    Allergies: Lisinopril    Review of Systems  Constitutional:   Negative for chills and fever.  Respiratory:  Positive for shortness of breath.   Cardiovascular:  Negative for chest pain.  Gastrointestinal:  Positive for nausea and vomiting. Negative for abdominal pain, constipation and diarrhea.  Neurological:  Negative for dizziness and headaches.    Updated Vital Signs BP (!) 208/95   Pulse 93   Temp 97.9 F (36.6 C) (Oral)   Resp (!) 23   SpO2 92%   Physical Exam Constitutional:      Appearance: She is well-developed.  HENT:     Head: Normocephalic and atraumatic.  Cardiovascular:     Rate and Rhythm: Tachycardia present.  Pulmonary:     Effort: Pulmonary effort is normal.     Breath sounds: Normal breath sounds.  Musculoskeletal:        General: Normal range of motion.  Skin:    General: Skin is warm and dry.  Neurological:     Mental Status: She is alert and oriented to person, place, and time.  Psychiatric:        Mood and Affect: Mood normal.        Behavior: Behavior normal.     (all labs ordered are listed, but only abnormal results are displayed) Labs Reviewed  LIPASE, BLOOD - Abnormal; Notable for the following components:      Result Value   Lipase 72 (*)    All other components within normal limits  COMPREHENSIVE METABOLIC PANEL WITH GFR - Abnormal; Notable for  the following components:   Potassium 5.9 (*)    Glucose, Bld 138 (*)    BUN 87 (*)    Creatinine, Ser 12.00 (*)    GFR, Estimated 3 (*)    Anion gap 20 (*)    All other components within normal limits  CBC WITH DIFFERENTIAL/PLATELET - Abnormal; Notable for the following components:   RBC 3.25 (*)    Hemoglobin 8.8 (*)    HCT 27.9 (*)    RDW 17.2 (*)    Platelets 116 (*)    All other components within normal limits  PRO BRAIN NATRIURETIC PEPTIDE - Abnormal; Notable for the following components:   Pro Brain Natriuretic Peptide 16,089.0 (*)    All other components within normal limits  TROPONIN T, HIGH SENSITIVITY - Abnormal; Notable for the  following components:   Troponin T High Sensitivity 116 (*)    All other components within normal limits  RESP PANEL BY RT-PCR (RSV, FLU A&B, COVID)  RVPGX2  URINALYSIS, ROUTINE W REFLEX MICROSCOPIC  HEMOGLOBIN A1C    EKG: None  Radiology: DG Chest Portable 1 View Result Date: 07/29/2024 CLINICAL DATA:  Shortness of breath.  Missed hemodialysis today. EXAM: PORTABLE CHEST 1 VIEW COMPARISON:  Chest radiographs 03/22/2017. FINDINGS: 1418 hours. The heart size and mediastinal contours are stable. Interval increased diffuse interstitial opacities consistent with pulmonary edema. No confluent airspace disease, pneumothorax or significant pleural effusion. Stable degenerative changes in the spine. No acute osseous abnormalities are identified. IMPRESSION: Interval increased diffuse interstitial opacities consistent with pulmonary edema or volume overload. No confluent airspace disease. Electronically Signed   By: Elsie Perone M.D.   On: 07/29/2024 14:36     Medications Ordered in the ED  sodium zirconium cyclosilicate  (LOKELMA ) packet 10 g (has no administration in time range)  calcium  gluconate 1 g/ 50 mL sodium chloride  IVPB (1,000 mg Intravenous New Bag/Given 07/29/24 1724)  hydrALAZINE  (APRESOLINE ) injection 10 mg (has no administration in time range)  isosorbide  mononitrate (IMDUR ) 24 hr tablet 30 mg (has no administration in time range)  aspirin  EC tablet 81 mg (has no administration in time range)  HYDROcodone -acetaminophen  (NORCO/VICODIN) 5-325 MG per tablet 1 tablet (has no administration in time range)  insulin  aspart protamine- aspart (NOVOLOG  MIX 70/30) injection 50 Units (has no administration in time range)  saccharomyces boulardii (FLORASTOR) capsule 250 mg (has no administration in time range)  amLODipine  (NORVASC ) tablet 10 mg (has no administration in time range)  cloNIDine  (CATAPRES ) tablet 0.1 mg (has no administration in time range)  insulin  aspart (novoLOG ) injection  0-15 Units (has no administration in time range)  albuterol  (PROVENTIL ) (2.5 MG/3ML) 0.083% nebulizer solution 2.5 mg (has no administration in time range)  ondansetron  (ZOFRAN ) injection 4 mg (4 mg Intravenous Given 07/29/24 1626)  insulin  aspart (novoLOG ) injection 5 Units (5 Units Intravenous Given 07/29/24 1625)    And  dextrose  50 % solution 50 mL (50 mLs Intravenous Given 07/29/24 1718)    Clinical Course as of 07/29/24 1731  Mon Jul 29, 2024  1428 Hemoglobin(!): 8.8 Similar to baseline  [AY]  1625 Dr. Dennise with hospitalist willing to accept patient. Advised ED to ED transfer for urgent dialysis as no beds currently available at [facility].  [AY]    Clinical Course User Index [AY] Neysa Thersia RAMAN, PA-C  Medical Decision Making Patient is a 64 year old female with a history of CHF and end-stage renal disease on dialysis who presents to the ED for increasing shortness of breath and nausea/vomiting that began last evening.  Patient notes she missed dialysis this morning as her ride did not show up.  Normally Monday Wednesday Friday, last dialysis on Friday last week.  On exam patient is alert and in no acute stroke revealed no abnormal blood.  She is mildly tachypneic at 20-25.  She is stable on her normal 3 L O2 via nasal cannula on initial evaluation.  When patient did move all fall asleep, oxygen did drop into the 80s, was placed on 4 L nasal cannula.  Lab workup significant for elevated troponin of 116 as well as elevated proBNP of around 16,000. Will trend delta troponin.  Suspect this is all secondary to fluid overload due to missing dialysis.  Hyperkalemia of 5.9 noted as well.  No acute EKG changes noted.  Less concern for STEMI at this time as well.  Chest x-ray reviewed that does show signs of pulmonary edema and fluid overload.  As she does have electrolyte abnormalities as well as elevated troponin, hospitalist has been consulted and Dr. Dennise is  agreeable to admit patient. Medications for hyperkalemia have been started as well as zofran  for nausea. Nephrology was paged, I was advised by carelink that hospitalist spoke with Dr. Melia with nephrology for consult. Patient is stable at this time and awaiting transfer to [facility].  Amount and/or Complexity of Data Reviewed Labs: ordered. Decision-making details documented in ED Course. Radiology: ordered.  Risk OTC drugs. Prescription drug management. Decision regarding hospitalization.       Final diagnoses:  Shortness of breath  Hyperkalemia    ED Discharge Orders     None          Neysa Thersia RAMAN, [place] 07/29/24 1731    Ula Prentice SAUNDERS, MD 07/29/24 2249  "

## 2024-07-29 NOTE — ED Notes (Signed)
 Pt arrives via POV sob, pt did not have O2 on her  at that time states she missed her dialysis today is a [facility] schedule , is on 3 l [place] all the time and they did not leave her O2 with her when they dropped her off , pt aaox 4 placed immediatly on O2 at  3 l upon  arrival to room.

## 2024-07-29 NOTE — ED Notes (Signed)
 RT assessed patient upon arrival due to home 02 use. Stated she wears 3L at home all the time, but came in on RA with low SATs. Currently on 3 LNC, BBS crackles with congested cough, noted increased WOB when laying flat so helped to reposition. Patient missed her dialysis yesterday. RN at bedside.

## 2024-07-29 NOTE — ED Notes (Signed)
 Called to room pt had vomited on floor and cont to vomit dr aware pt medicated

## 2024-07-29 NOTE — H&P (Addendum)
 "                                                                                                                                                                                                                                                                                                                                                                         Tele-Hospitalist H&P Note    Patient Demographics:   Kathryn Dillon, is a 64 y.o. female  MRN: [id number]   DOB - February 18, 1961  Referring Provider: EDP  Telemedicine Provider: Lavada Stank M.D   Patient Location:  [facility] ER  Referring Diagnosis:    Chief Complaint  Patient presents with   Shortness of Breath   Emesis    Patient Name and DOB verified: Kathryn Dillon,  DOB - January 12, 1961    Patient consented to Telemedicine Evaluation:Yes  RN virtual assistant: RN Ms Montie  Video encounter time and date: 07/29/2024 at 4:51 PM       HPI:    Kathryn Dillon  is a 64 y.o. female, with history of ESRD on dialysis, chronic diastolic heart failure last documented EF is 60% on echocardiogram done in 2018, insulin -dependent DM type II, hypertension, dyslipidemia, COPD, documented history of drug abuse and alcohol abuse denies any use currently.  PAD.  Patient with above history who lives with her family at home, walks with a walker and mostly moves around in a wheelchair presents to the [facility] ER with shortness of breath which happened due to her missing her dialysis today, apparently her ride did not show up, she describes shortness of breath which is worse on laying flat and better on sitting up, she usually uses 2 to 3 L nasal cannula oxygen but is requiring  5 to 6 L currently, in the ER she was diagnosed with fluid overload acute on chronic diastolic CHF due to missed dialysis and I was called to admit the patient.  She currently denies any headache, no fever or chills, no cough, no chest pain whatsoever, shortness of breath as  above, no abdominal pain or discomfort, no diarrhea or dysuria, no blood in stool, some mild swelling in her legs, no focal weakness.  Her main complaint is shortness of breath.   Review of systems:    A full 10 point Review of Systems was done, except as stated above, all other Review of Systems were negative.   With Past History of the following :    Past Medical History:  Diagnosis Date   CHF (congestive heart failure) (HCC)    COPD (chronic obstructive pulmonary disease) with chronic bronchitis (HCC)    Diabetes mellitus without complication (HCC)    H/O drug abuse (HCC)    H/O ETOH abuse    High cholesterol    Hypertension    PAD (peripheral artery disease)    Renal disorder       History reviewed. No pertinent surgical history.    Social History:     Social History   Tobacco Use   Smoking status: Every Day    Types: Cigarettes   Smokeless tobacco: Never  Substance Use Topics   Alcohol use: No        Family History :   No family history of ESRD   Home Medications:   Prior to Admission medications  Medication Sig Start Date End Date Taking? Authorizing Provider  aspirin  EC 81 MG EC tablet Take 1 tablet (81 mg total) by mouth daily. 03/26/17  Yes Gherghe, Costin M, MD  HYDROcodone -acetaminophen  (NORCO/VICODIN) 5-325 MG tablet Take 1 tablet by mouth every 6 (six) hours as needed for severe pain. 03/26/17  Yes Trixie Nilda HERO, MD  insulin  NPH-regular Human (HUMULIN  70/30) (70-30) 100 UNIT/ML injection Inject 80 Units into the skin 2 (two) times daily with a meal.   Yes [provider]  OXYGEN Inhale into the lungs as needed. 2L [place] PRN   Yes [provider]  saccharomyces boulardii (FLORASTOR) 250 MG capsule Take 1 capsule (250 mg total) by mouth 2 (two) times daily. 03/26/17  Yes Gherghe, Costin M, MD     Allergies:    Allergies[1]   Physical Exam: Assisted by patient's RN over the camera   Vitals  Blood pressure (!) 208/95, pulse 93,  temperature 97.9 F (36.6 C), temperature source Oral, resp. rate (!) 23, SpO2 92%.   Virtual exam done over Video assisted by RN  1. General Elderly African-American female lying in hospital bed in mild respiratory distress wearing oxygen,  2. Normal affect and insight, Not Suicidal or Homicidal, Awake Alert,   3. No F.N deficits, Strength & Sensation equally intact all 4 extremities ,    4. Ears and Eyes appear Normal,   5. Supple Neck,    6. Symmetrical Chest wall movement, Good air movement bilaterally, bibasilar crackles  7. RRR, No Murmurs,   8. Positive Bowel Sounds, Abdomen Soft, No tenderness,    9.  Trace to 1+ bipedal edema  10. Normal ROM.      Data Review:   Recent Labs  Lab 07/29/24 1415  WBC 9.1  HGB 8.8*  HCT 27.9*  PLT 116*  MCV 85.8  MCH 27.1  MCHC 31.5  RDW 17.2*  LYMPHSABS 0.7  MONOABS  0.5  EOSABS 0.3  BASOSABS 0.0    Recent Labs  Lab 07/29/24 1415  NA 141  K 5.9*  CL 99  CO2 22  ANIONGAP 20*  GLUCOSE 138*  BUN 87*  CREATININE 12.00*  AST 15  ALT 8  ALKPHOS 123  BILITOT 0.5  ALBUMIN  4.4  CALCIUM  9.8    Lab Results  Component Value Date   CHOL 124 03/22/2017   HDL 15 (L) 03/22/2017   LDLCALC 60 03/22/2017   TRIG 244 (H) 03/22/2017   CHOLHDL 8.3 03/22/2017    Recent Labs  Lab 07/29/24 1415  CALCIUM  9.8    Recent Labs  Lab 07/29/24 1415  WBC 9.1  PLT 116*  CREATININE 12.00*    Urinalysis    Component Value Date/Time   COLORURINE YELLOW 03/22/2017 1500   APPEARANCEUR HAZY (A) 03/22/2017 1500   LABSPEC 1.039 (H) 03/22/2017 1500   PHURINE 5.0 03/22/2017 1500   GLUCOSEU 150 (A) 03/22/2017 1500   HGBUR LARGE (A) 03/22/2017 1500   BILIRUBINUR NEGATIVE 03/22/2017 1500   KETONESUR NEGATIVE 03/22/2017 1500   PROTEINUR 100 (A) 03/22/2017 1500   NITRITE NEGATIVE 03/22/2017 1500   LEUKOCYTESUR NEGATIVE 03/22/2017 1500      Imaging Results:    DG Chest Portable 1 View Result Date: 07/29/2024 CLINICAL  DATA:  Shortness of breath.  Missed hemodialysis today. EXAM: PORTABLE CHEST 1 VIEW COMPARISON:  Chest radiographs 03/22/2017. FINDINGS: 1418 hours. The heart size and mediastinal contours are stable. Interval increased diffuse interstitial opacities consistent with pulmonary edema. No confluent airspace disease, pneumothorax or significant pleural effusion. Stable degenerative changes in the spine. No acute osseous abnormalities are identified. IMPRESSION: Interval increased diffuse interstitial opacities consistent with pulmonary edema or volume overload. No confluent airspace disease. Electronically Signed   By: Elsie Perone M.D.   On: 07/29/2024 14:36    My personal review of EKG: Rhythm ST, 100 /min,  no Acute ST changes   Assessment & Plan:    1.  Acute on chronic hypoxic respiratory failure due to acute on chronic diastolic CHF last EF 60% on echocardiogram in 2018.  This is due to missed HD treatment, she could not go to dialysis due to her transportation not showing up, she appears to be in fluid overload, no chest pain or palpitations, no cough or fever.  She will be provided with supplemental oxygen, nebulizer treatments, no wheezing on exam except crackles, nephrology has been consulted, discussed with Dr. Melia HD as soon as possible.  2.  ESRD on MWF schedule.  Nephrology on-call consulted.  3.  Hypertension.  In poor control due to respiratory distress.  Placed on Norvasc , Catapres  and hydralazine  as needed.  Please review med rec she is unsure if she takes any other blood pressure medications.  Med rec is partially done.  4.  Hyperkalemia.  Lokelma  along with treatment as above.  5.  Morbid obesity.  BMI greater than 35.  Follow-up with PCP.  Uses nighttime oxygen.  6.  Mild elevation in troponin.  No chest pain, EKG nonacute, due to demand mismatch from hypoxia and missed dialysis.  No further workup.  Continue aspirin .   7.  DM type II.  Twice daily 70/30 insulin  less than home  dose as she is n.p.o. for now, every 4 sliding scale insulin .  Monitor and adjust.    Transfusion OK if needed CPAP/BiPAP  yes if needed     DVT Prophylaxis Heparin    AM Labs Ordered, also please review  Full Orders  Family Communication: Admission, patients condition and plan of care including tests being ordered have been discussed with the patient  who indicates understanding and agree with the plan and Code Status.  Code Status Full  Likely DC to  TBD  Condition GUARDED    Consults called: Renal    Admission status: Inpt    Time spent in minutes : 40  Signature  -    Lavada Stank M.D on 07/29/2024 at 4:51 PM   -  To page go to [email or web address]              [1]  Allergies Allergen Reactions   Lisinopril Cough   "

## 2024-07-29 NOTE — ED Notes (Signed)
 Report to [address]

## 2024-07-29 NOTE — ED Notes (Signed)
 Spoke to admit dr

## 2024-07-29 NOTE — ED Notes (Signed)
 Patient is vomiting and SATS drop into the low 80's when she does. Increased to 4L

## 2024-07-30 LAB — CBC
HCT: 25.2 % — ABNORMAL LOW (ref 36.0–46.0)
Hemoglobin: 7.9 g/dL — ABNORMAL LOW (ref 12.0–15.0)
MCH: 26.5 pg (ref 26.0–34.0)
MCHC: 31.3 g/dL (ref 30.0–36.0)
MCV: 84.6 fL (ref 80.0–100.0)
Platelets: 119 10*3/uL — ABNORMAL LOW (ref 150–400)
RBC: 2.98 MIL/uL — ABNORMAL LOW (ref 3.87–5.11)
RDW: 17.1 % — ABNORMAL HIGH (ref 11.5–15.5)
WBC: 6.3 10*3/uL (ref 4.0–10.5)
nRBC: 0 % (ref 0.0–0.2)

## 2024-07-30 LAB — RENAL FUNCTION PANEL
Albumin: 4.2 g/dL (ref 3.5–5.0)
Anion gap: 17 — ABNORMAL HIGH (ref 5–15)
BUN: 38 mg/dL — ABNORMAL HIGH (ref 8–23)
CO2: 28 mmol/L (ref 22–32)
Calcium: 9.4 mg/dL (ref 8.9–10.3)
Chloride: 92 mmol/L — ABNORMAL LOW (ref 98–111)
Creatinine, Ser: 6.62 mg/dL — ABNORMAL HIGH (ref 0.44–1.00)
GFR, Estimated: 7 mL/min — ABNORMAL LOW
Glucose, Bld: 120 mg/dL — ABNORMAL HIGH (ref 70–99)
Phosphorus: 4.7 mg/dL — ABNORMAL HIGH (ref 2.5–4.6)
Potassium: 4.4 mmol/L (ref 3.5–5.1)
Sodium: 137 mmol/L (ref 135–145)

## 2024-07-30 LAB — GLUCOSE, CAPILLARY
Glucose-Capillary: 157 mg/dL — ABNORMAL HIGH (ref 70–99)
Glucose-Capillary: 106 mg/dL — ABNORMAL HIGH (ref 70–99)
Glucose-Capillary: 135 mg/dL — ABNORMAL HIGH (ref 70–99)
Glucose-Capillary: 110 mg/dL — ABNORMAL HIGH (ref 70–99)
Glucose-Capillary: 84 mg/dL (ref 70–99)

## 2024-07-30 LAB — CREATININE, SERUM
Creatinine, Ser: 6.8 mg/dL — ABNORMAL HIGH (ref 0.44–1.00)
GFR, Estimated: 6 mL/min — ABNORMAL LOW

## 2024-07-30 LAB — HEPATITIS B SURFACE ANTIGEN: Hepatitis B Surface Ag: NONREACTIVE

## 2024-07-30 LAB — HEMOGLOBIN A1C
Hgb A1c MFr Bld: 7.9 % — ABNORMAL HIGH (ref 4.8–5.6)
Mean Plasma Glucose: 180.03 mg/dL

## 2024-07-30 LAB — ABO/RH: ABO/RH(D): A POS

## 2024-07-30 MED ORDER — HYDRALAZINE HCL 20 MG/ML IJ SOLN: 10.0000 mg | INTRAMUSCULAR | Status: DC | PRN

## 2024-07-30 MED ORDER — CARVEDILOL 25 MG PO TABS
25.0000 mg | ORAL_TABLET | Freq: Two times a day (BID) | ORAL | Status: DC
  Administered 2024-07-30 (×2): 25 mg via ORAL
  Filled 2024-07-30: qty 1
  Filled 2024-07-30: qty 2

## 2024-07-30 MED ORDER — INSULIN ASPART PROT & ASPART (70-30 MIX) 100 UNIT/ML ~~LOC~~ SUSP
25.0000 [IU] | Freq: Two times a day (BID) | SUBCUTANEOUS | Status: DC
  Administered 2024-07-30 (×2): 25 [IU] via SUBCUTANEOUS

## 2024-07-30 MED ORDER — ACETAMINOPHEN 650 MG RE SUPP: 650.0000 mg | Freq: Four times a day (QID) | RECTAL | Status: DC | PRN

## 2024-07-30 MED ORDER — BISACODYL 5 MG PO TBEC: 5.0000 mg | DELAYED_RELEASE_TABLET | Freq: Every day | ORAL | Status: DC | PRN

## 2024-07-30 MED ORDER — PROMETHAZINE HCL 12.5 MG PO TABS: 12.5000 mg | ORAL_TABLET | Freq: Four times a day (QID) | ORAL | Status: DC | PRN

## 2024-07-30 MED ORDER — CINACALCET HCL 30 MG PO TABS: 30.0000 mg | ORAL_TABLET | ORAL | Status: DC

## 2024-07-30 MED ORDER — HEPARIN SODIUM (PORCINE) 5000 UNIT/ML IJ SOLN
5000.0000 [IU] | Freq: Three times a day (TID) | INTRAMUSCULAR | Status: DC
  Administered 2024-07-30 – 2024-07-31 (×3): 5000 [IU] via SUBCUTANEOUS
  Filled 2024-07-30 (×3): qty 1

## 2024-07-30 MED ORDER — ACETAMINOPHEN 325 MG PO TABS: 650.0000 mg | ORAL_TABLET | Freq: Four times a day (QID) | ORAL | Status: DC | PRN

## 2024-07-30 NOTE — Progress Notes (Signed)
 " PROGRESS NOTE    [name]  FMW:[id number] DOB: 08-18-1960 DOA: 07/29/2024 PCP: Catalina Bare, MD   Chief Complaint  Patient presents with   Shortness of Breath   Emesis    Brief Narrative:   Kathryn Dillon  is a 64 y.o. female, with history of ESRD on dialysis, chronic diastolic heart failure last documented EF is 60% on echocardiogram done in 2018, insulin -dependent DM type II, hypertension, dyslipidemia, COPD, documented history of drug abuse and alcohol abuse denies any use currently.  PAD.   Patient with above history who lives with her family at home, walks with a walker and mostly moves around in a wheelchair presents to the [facility] ER with shortness of breath which happened due to her missing her dialysis today, apparently her ride did not show up, she describes shortness of breath which is worse on laying flat and better on sitting up, she usually uses 2 to 3 L nasal cannula oxygen but is requiring 5 to 6 L currently, in the ER she was diagnosed with fluid overload acute on chronic diastolic CHF due to missed dialysis and I was called to admit the patient.   She currently denies any headache, no fever or chills, no cough, no chest pain whatsoever, shortness of breath as above, no abdominal pain or discomfort, no diarrhea or dysuria, no blood in stool, some mild swelling in her legs, no focal weakness.  Her main complaint is shortness of breath.  Assessment & Plan:   Principal Problem:   Acute hypoxemic respiratory failure (HCC)    Acute on chronic hypoxic respiratory failure  Acute on chronic . Acute on chronic diastolic CHF/pulmonary edema Hypertensive urgency -Dyspnea, hypoxia, with increased oxygen requirement and work of breathing secondary to volume overload/pulmonary edema and hemodialysis patient, with uncontrolled blood pressure and known chronic diastolic CHF at baseline  - Chest x-ray significant for pulmonary edema . - Blood pressure  significantly uncontrolled  - .Renal input greatly appreciated, went for dialysis overnight with much improvement of her symptoms 3.9 L removed overnight - Encouraged use incentive spirometry and flutter valve   ESRD on MWF schedule.   - Nephrology on-call consulted.  If dialysis overnight, next HD scheduled for tomorrow   Hypertensive urgency - Blood pressure significantly elevated initially, improved on current regimen amlodipine  10 mg, Coreg  25 mg twice daily, Imdur  30 mg daily, on as needed clonidine  and hydralazine .  - As well improved with dialysis, 3.9 L removed yesterday.    Hyperkalemia.   - Lokelma  on admission, as well as went for HD yesterday Lokelma  along with treatment as above.  Obesity class I - Body mass index is 37.46 kg/m.  Anemia of chronic renal disease  -monitor CBC, hemoglobin 7.9 this morning, no indication for transfusion  Mild elevation in troponin.   No chest pain, EKG nonacute, due to demand mismatch from hypoxia and missed dialysis.  No further workup.  Continue aspirin .    DM type II.   -Twice daily 70/30 insulin  50 units twice daily, will resume at a much lower dose at 25 units twice daily, continue with insulin  sliding scale.   DVT prophylaxis: Heparin  Code Status: Full Family Communication: none at bedside Disposition:   Status is: Inpatient    Consultants:  Renal    Subjective:  He denies any chest pain this morning, denies any further dyspnea, reports he is feeling better  Objective: Vitals:   07/30/24 0301 07/30/24 0616 07/30/24 0741 07/30/24 1140  BP: ROLLEN)  197/97 (!) 197/97  126/72  Pulse: (!) 110   99  Resp: 18   20  Temp: (!) 97.4 F (36.3 C)  97.6 F (36.4 C) 98.2 F (36.8 C)  TempSrc: Oral  Oral Oral  SpO2: 98%   97%  Weight:      Height:        Intake/Output Summary (Last 24 hours) at 07/30/2024 1439 Last data filed at 07/30/2024 0800 Gross per 24 hour  Intake 320 ml  Output 3900 ml  Net -3580 ml   Filed Weights    07/29/24 1924  Weight: 99 kg    Examination:  Awake Alert, Oriented X 3, No new F.N deficits, Normal affect Scattered coarse respiratory sounds RRR,No Gallops,Rubs or new Murmurs, No Parasternal Heave +ve B.Sounds, Abd Soft, No tenderness, No rebound - guarding or rigidity. No Cyanosis, Clubbing or edema, No new Rash or bruise     Data Reviewed: I have personally reviewed following labs and imaging studies  CBC: Recent Labs  Lab 07/29/24 1415 07/30/24 0831  WBC 9.1 6.3  NEUTROABS 7.5  --   HGB 8.8* 7.9*  HCT 27.9* 25.2*  MCV 85.8 84.6  PLT 116* 119*    Basic Metabolic Panel: Recent Labs  Lab 07/29/24 1415 07/30/24 0452 07/30/24 0831  NA 141 137  --   K 5.9* 4.4  --   CL 99 92*  --   CO2 22 28  --   GLUCOSE 138* 120*  --   BUN 87* 38*  --   CREATININE 12.00* 6.62* 6.80*  CALCIUM  9.8 9.4  --   PHOS  --  4.7*  --     GFR: Estimated Creatinine Clearance: 9.8 mL/min (A) (by C-G formula based on SCr of 6.8 mg/dL (H)).  Liver Function Tests: Recent Labs  Lab 07/29/24 1415 07/30/24 0452  AST 15  --   ALT 8  --   ALKPHOS 123  --   BILITOT 0.5  --   PROT 7.4  --   ALBUMIN  4.4 4.2    CBG: Recent Labs  Lab 07/29/24 1939 07/30/24 0236 07/30/24 0740 07/30/24 1139  GLUCAP 164* 157* 106* 135*     Recent Results (from the past 240 hours)  Resp panel by RT-PCR (RSV, Flu A&B, Covid) Anterior Nasal Swab     Status: None   Collection Time: 07/29/24  2:15 PM   Specimen: Anterior Nasal Swab  Result Value Ref Range Status   SARS Coronavirus 2 by RT PCR NEGATIVE NEGATIVE Final    Comment: (NOTE) SARS-CoV-2 target nucleic acids are NOT DETECTED.  The SARS-CoV-2 RNA is generally detectable in upper respiratory specimens during the acute phase of infection. The lowest concentration of SARS-CoV-2 viral copies this assay can detect is 138 copies/mL. A negative result does not preclude SARS-Cov-2 infection and should not be used as the sole basis for treatment  or other patient management decisions. A negative result may occur with  improper specimen collection/handling, submission of specimen other than nasopharyngeal swab, presence of viral mutation(s) within the areas targeted by this assay, and inadequate number of viral copies(<138 copies/mL). A negative result must be combined with clinical observations, patient history, and epidemiological information. The expected result is Negative.  Fact Sheet for Patients:  bloggercourse.com  Fact Sheet for Healthcare Providers:  seriousbroker.it  This test is no t yet approved or cleared by the [place]  FDA and  has been authorized for detection and/or diagnosis of SARS-CoV-2 by FDA under an Emergency  Use Authorization (EUA). This EUA will remain  in effect (meaning this test can be used) for the duration of the COVID-19 declaration under Section 564(b)(1) of the Act, 21 U.S.C.section 360bbb-3(b)(1), unless the authorization is terminated  or revoked sooner.       Influenza A by PCR NEGATIVE NEGATIVE Final   Influenza B by PCR NEGATIVE NEGATIVE Final    Comment: (NOTE) The Xpert Xpress SARS-CoV-2/FLU/RSV plus assay is intended as an aid in the diagnosis of influenza from Nasopharyngeal swab specimens and should not be used as a sole basis for treatment. Nasal washings and aspirates are unacceptable for Xpert Xpress SARS-CoV-2/FLU/RSV testing.  Fact Sheet for Patients: bloggercourse.com  Fact Sheet for Healthcare Providers: seriousbroker.it  This test is not yet approved or cleared by the [place]  FDA and has been authorized for detection and/or diagnosis of SARS-CoV-2 by FDA under an Emergency Use Authorization (EUA). This EUA will remain in effect (meaning this test can be used) for the duration of the COVID-19 declaration under Section 564(b)(1) of the Act, 21 U.S.C. section  360bbb-3(b)(1), unless the authorization is terminated or revoked.     Resp Syncytial Virus by PCR NEGATIVE NEGATIVE Final    Comment: (NOTE) Fact Sheet for Patients: bloggercourse.com  Fact Sheet for Healthcare Providers: seriousbroker.it  This test is not yet approved or cleared by the [place]  FDA and has been authorized for detection and/or diagnosis of SARS-CoV-2 by FDA under an Emergency Use Authorization (EUA). This EUA will remain in effect (meaning this test can be used) for the duration of the COVID-19 declaration under Section 564(b)(1) of the Act, 21 U.S.C. section 360bbb-3(b)(1), unless the authorization is terminated or revoked.  Performed at [facility], [address], [place], [place] [address]          Radiology Studies: DG Chest Portable 1 View Result Date: 07/29/2024 CLINICAL DATA:  Shortness of breath.  Missed hemodialysis today. EXAM: PORTABLE CHEST 1 VIEW COMPARISON:  Chest radiographs 03/22/2017. FINDINGS: 1418 hours. The heart size and mediastinal contours are stable. Interval increased diffuse interstitial opacities consistent with pulmonary edema. No confluent airspace disease, pneumothorax or significant pleural effusion. Stable degenerative changes in the spine. No acute osseous abnormalities are identified. IMPRESSION: Interval increased diffuse interstitial opacities consistent with pulmonary edema or volume overload. No confluent airspace disease. Electronically Signed   By: Elsie Perone M.D.   On: 07/29/2024 14:36        Scheduled Meds:  amLODipine   10 mg Oral Daily   aspirin  EC  81 mg Oral Daily   calcium  acetate  1,334 mg Oral TID WC   carvedilol   25 mg Oral BID WC   Chlorhexidine  Gluconate Cloth  6 each Topical Q0600   [START ON 07/31/2024] cinacalcet   30 mg Oral Q M,W,F   heparin   5,000 Units Subcutaneous Q8H   insulin  aspart  0-15 Units Subcutaneous Q4H   insulin   aspart protamine- aspart  25 Units Subcutaneous BID WC   isosorbide  mononitrate  30 mg Oral Daily   saccharomyces boulardii  250 mg Oral BID   Continuous Infusions:   LOS: 1 day       Capucine Tryon, MD [facility]   To contact the attending provider between 7A-7P or the covering provider during after hours 7P-7A, please log into the web site [email or web address] and access using universal [place] password for that web site. If you do not have the password, please call the hospital operator.  07/30/2024, 2:39 PM   "

## 2024-07-30 NOTE — Progress Notes (Signed)
 Pt receives out-pt HD at [facility] on MWF 11:15 am arrival for 11:30 am chair time. Will assist as needed.   Randine Mungo Dialysis Navigator [phone]

## 2024-07-30 NOTE — Progress Notes (Incomplete)
 "                        PROGRESS NOTE        PATIENT DETAILS Name: Kathryn Dillon Age: 64 y.o. Sex: female Date of Birth: [birth year] Admit Date: 07/29/2024 Admitting Physician Lavada MARLA Stank, MD ERE:[id number], Zachary, MD  Brief Summary: Patient is a 64 y.o.  female with history of ESRD on HD, chronic HFpEF, DM-2, HTN, HLD, COPD on home O2 2-3 L, PAD-who presented to [facility] with shortness of breath after missed HD (right did not show up)-she was found to have acute on chronic hypoxic respiratory failure secondary to decompensated diastolic heart failure from missed HD.  Significant events: 1/26>> admit to [facility]  Significant studies: 1/26>> CXR:+ Pulmonary edema  Significant microbiology data: 1/26>> COVID/influenza/RSV PCR: Negative  Procedures: None  Consults: Nephrology  Subjective: Lying comfortably in bed-denies any chest pain or shortness of breath.  Objective: Vitals: Blood pressure 126/68, pulse 92, temperature 99.3 F (37.4 C), temperature source Oral, resp. rate 17, height 5' 4 (1.626 m), weight 99 kg, SpO2 98%.   Exam: Gen Exam:Alert awake-not in any distress HEENT:atraumatic, normocephalic Chest: B/L clear to auscultation anteriorly CVS:S1S2 regular Abdomen:soft non tender, non distended Extremities:no edema Neurology: Non focal Skin: no rash  Pertinent Labs/Radiology:    Latest Ref Rng & Units 07/30/2024    8:31 AM 07/29/2024    2:15 PM 12/28/2020    7:25 PM  CBC  WBC 4.0 - 10.5 K/uL 6.3  9.1  6.9   Hemoglobin 12.0 - 15.0 g/dL 7.9  8.8  8.6   Hematocrit 36.0 - 46.0 % 25.2  27.9  27.4   Platelets 150 - 400 K/uL 119  116  240     Lab Results  Component Value Date   NA 137 07/30/2024   K 4.4 07/30/2024   CL 92 (L) 07/30/2024   CO2 28 07/30/2024      Assessment/Plan: Acute on chronic hypoxic respiratory failure secondary to HFpEF exacerbation-due to missed HD Significantly better after HD Back on usual 2-3 L of  oxygen.  Hypertensive urgency Much better after HD and initiation of antihypertensives Continue amlodipine /Coreg /Imdur   ESRD on HD MWF Underwent HD x 2-plans are to discharge home later today.  Hyperkalemia Resolved with Lokelma /HD.  Normocytic anemia Secondary to ESRD Aranesp/iron therapies defer to nephrology service  DM-2 CBG stable. Continue insulin  70/30 on discharge  Chronic debility/deconditioning Uses combination of walker around the house-for longer distances-she uses a wheelchair   Class 2 Obesity: Estimated body mass index is 37.46 kg/m as calculated from the following:   Height as of this encounter: 5' 4 (1.626 m).   Weight as of this encounter: 99 kg.   Code status:   Code Status: Full Code   DVT Prophylaxis: heparin  injection 5,000 Units Start: 07/30/24 1400   Family Communication: None at bedside  Disposition Plan: Status is: Inpatient Remains inpatient appropriate because: Severity of illness   Planned Discharge Destination:Home   Diet: Diet Order             Diet renal/carb modified with fluid restriction Diet-HS Snack? Nothing; Fluid restriction: 1200 mL Fluid; Room service appropriate? Yes; Fluid consistency: Thin  Diet effective now                     Antimicrobial agents: Anti-infectives (From admission, onward)    None        MEDICATIONS: Scheduled Meds:  amLODipine   10 mg Oral Daily   aspirin  EC  81 mg Oral Daily   calcium  acetate  1,334 mg Oral TID WC   carvedilol   25 mg Oral BID WC   Chlorhexidine  Gluconate Cloth  6 each Topical Q0600   [START ON 07/31/2024] cinacalcet   30 mg Oral Q M,W,F   heparin   5,000 Units Subcutaneous Q8H   insulin  aspart  0-15 Units Subcutaneous Q4H   insulin  aspart protamine- aspart  25 Units Subcutaneous BID WC   isosorbide  mononitrate  30 mg Oral Daily   saccharomyces boulardii  250 mg Oral BID   Continuous Infusions: PRN Meds:.acetaminophen  **OR** acetaminophen , albuterol ,  bisacodyl , cloNIDine , hydrALAZINE , HYDROcodone -acetaminophen , promethazine    I have personally reviewed following labs and imaging studies  LABORATORY DATA: CBC: Recent Labs  Lab 07/29/24 1415 07/30/24 0831  WBC 9.1 6.3  NEUTROABS 7.5  --   HGB 8.8* 7.9*  HCT 27.9* 25.2*  MCV 85.8 84.6  PLT 116* 119*    Basic Metabolic Panel: Recent Labs  Lab 07/29/24 1415 07/30/24 0452 07/30/24 0831  NA 141 137  --   K 5.9* 4.4  --   CL 99 92*  --   CO2 22 28  --   GLUCOSE 138* 120*  --   BUN 87* 38*  --   CREATININE 12.00* 6.62* 6.80*  CALCIUM  9.8 9.4  --   PHOS  --  4.7*  --     GFR: Estimated Creatinine Clearance: 9.8 mL/min (A) (by C-G formula based on SCr of 6.8 mg/dL (H)).  Liver Function Tests: Recent Labs  Lab 07/29/24 1415 07/30/24 0452  AST 15  --   ALT 8  --   ALKPHOS 123  --   BILITOT 0.5  --   PROT 7.4  --   ALBUMIN  4.4 4.2   Recent Labs  Lab 07/29/24 1415  LIPASE 72*   No results for input(s): AMMONIA in the last 168 hours.  Coagulation Profile: No results for input(s): INR, PROTIME in the last 168 hours.  Cardiac Enzymes: No results for input(s): CKTOTAL, CKMB, CKMBINDEX, TROPONINI in the last 168 hours.  BNP (last 3 results) Recent Labs    07/29/24 1415  PROBNP 16,089.0*    Lipid Profile: No results for input(s): CHOL, HDL, LDLCALC, TRIG, CHOLHDL, LDLDIRECT in the last 72 hours.  Thyroid Function Tests: No results for input(s): TSH, T4TOTAL, FREET4, T3FREE, THYROIDAB in the last 72 hours.  Anemia Panel: No results for input(s): VITAMINB12, FOLATE, FERRITIN, TIBC, IRON, RETICCTPCT in the last 72 hours.  Urine analysis:    Component Value Date/Time   COLORURINE YELLOW 03/22/2017 1500   APPEARANCEUR HAZY (A) 03/22/2017 1500   LABSPEC 1.039 (H) 03/22/2017 1500   PHURINE 5.0 03/22/2017 1500   GLUCOSEU 150 (A) 03/22/2017 1500   HGBUR LARGE (A) 03/22/2017 1500   BILIRUBINUR NEGATIVE  03/22/2017 1500   KETONESUR NEGATIVE 03/22/2017 1500   PROTEINUR 100 (A) 03/22/2017 1500   NITRITE NEGATIVE 03/22/2017 1500   LEUKOCYTESUR NEGATIVE 03/22/2017 1500    Sepsis Labs: Lactic Acid, Venous    Component Value Date/Time   LATICACIDVEN 1.14 03/22/2017 0522    MICROBIOLOGY: Recent Results (from the past 240 hours)  Resp panel by RT-PCR (RSV, Flu A&B, Covid) Anterior Nasal Swab     Status: None   Collection Time: 07/29/24  2:15 PM   Specimen: Anterior Nasal Swab  Result Value Ref Range Status   SARS Coronavirus 2 by RT PCR NEGATIVE NEGATIVE Final  Comment: (NOTE) SARS-CoV-2 target nucleic acids are NOT DETECTED.  The SARS-CoV-2 RNA is generally detectable in upper respiratory specimens during the acute phase of infection. The lowest concentration of SARS-CoV-2 viral copies this assay can detect is 138 copies/mL. A negative result does not preclude SARS-Cov-2 infection and should not be used as the sole basis for treatment or other patient management decisions. A negative result may occur with  improper specimen collection/handling, submission of specimen other than nasopharyngeal swab, presence of viral mutation(s) within the areas targeted by this assay, and inadequate number of viral copies(<138 copies/mL). A negative result must be combined with clinical observations, patient history, and epidemiological information. The expected result is Negative.  Fact Sheet for Patients:  bloggercourse.com  Fact Sheet for Healthcare Providers:  seriousbroker.it  This test is no t yet approved or cleared by the [place]  FDA and  has been authorized for detection and/or diagnosis of SARS-CoV-2 by FDA under an Emergency Use Authorization (EUA). This EUA will remain  in effect (meaning this test can be used) for the duration of the COVID-19 declaration under Section 564(b)(1) of the Act, 21 U.S.C.section 360bbb-3(b)(1),  unless the authorization is terminated  or revoked sooner.       Influenza A by PCR NEGATIVE NEGATIVE Final   Influenza B by PCR NEGATIVE NEGATIVE Final    Comment: (NOTE) The Xpert Xpress SARS-CoV-2/FLU/RSV plus assay is intended as an aid in the diagnosis of influenza from Nasopharyngeal swab specimens and should not be used as a sole basis for treatment. Nasal washings and aspirates are unacceptable for Xpert Xpress SARS-CoV-2/FLU/RSV testing.  Fact Sheet for Patients: bloggercourse.com  Fact Sheet for Healthcare Providers: seriousbroker.it  This test is not yet approved or cleared by the [place]  FDA and has been authorized for detection and/or diagnosis of SARS-CoV-2 by FDA under an Emergency Use Authorization (EUA). This EUA will remain in effect (meaning this test can be used) for the duration of the COVID-19 declaration under Section 564(b)(1) of the Act, 21 U.S.C. section 360bbb-3(b)(1), unless the authorization is terminated or revoked.     Resp Syncytial Virus by PCR NEGATIVE NEGATIVE Final    Comment: (NOTE) Fact Sheet for Patients: bloggercourse.com  Fact Sheet for Healthcare Providers: seriousbroker.it  This test is not yet approved or cleared by the [place]  FDA and has been authorized for detection and/or diagnosis of SARS-CoV-2 by FDA under an Emergency Use Authorization (EUA). This EUA will remain in effect (meaning this test can be used) for the duration of the COVID-19 declaration under Section 564(b)(1) of the Act, 21 U.S.C. section 360bbb-3(b)(1), unless the authorization is terminated or revoked.  Performed at [facility], [address] [place], [place] [address]     RADIOLOGY STUDIES/RESULTS: DG Chest Portable 1 View Result Date: 07/29/2024 CLINICAL DATA:  Shortness of breath.  Missed hemodialysis today. EXAM: PORTABLE  CHEST 1 VIEW COMPARISON:  Chest radiographs 03/22/2017. FINDINGS: 1418 hours. The heart size and mediastinal contours are stable. Interval increased diffuse interstitial opacities consistent with pulmonary edema. No confluent airspace disease, pneumothorax or significant pleural effusion. Stable degenerative changes in the spine. No acute osseous abnormalities are identified. IMPRESSION: Interval increased diffuse interstitial opacities consistent with pulmonary edema or volume overload. No confluent airspace disease. Electronically Signed   By: Elsie Perone M.D.   On: 07/29/2024 14:36     LOS: 1 day   Donalda Applebaum, MD  [facility]    To contact the attending provider between 7A-7P  or the covering provider during after hours 7P-7A, please log into the web site [email or web address] and access using universal [place] password for that web site. If you do not have the password, please call the hospital operator.  07/30/2024, 8:28 PM    "

## 2024-07-30 NOTE — Plan of Care (Signed)
" °  Problem: Education: Goal: Ability to describe self-care measures that may prevent or decrease complications (Diabetes Survival Skills Education) will improve Outcome: Progressing   Problem: Fluid Volume: Goal: Ability to maintain a balanced intake and output will improve Outcome: Progressing   Problem: Metabolic: Goal: Ability to maintain appropriate glucose levels will improve Outcome: Progressing   Problem: Nutritional: Goal: Maintenance of adequate nutrition will improve Outcome: Progressing   Problem: Education: Goal: Knowledge of General Education information will improve Description: Including pain rating scale, medication(s)/side effects and non-pharmacologic comfort measures Outcome: Progressing   Problem: Clinical Measurements: Goal: Will remain free from infection Outcome: Progressing Goal: Respiratory complications will improve Outcome: Progressing   Problem: Coping: Goal: Level of anxiety will decrease Outcome: Progressing   "

## 2024-07-30 NOTE — Progress Notes (Signed)
 Heart Failure Navigator Progress Note  Assessed for Heart & Vascular TOC clinic readiness.  Patient does not meet criteria due to ESRD on hemodialysis. No HF TOC.   Navigator will sign off at this time.   Randie Bustle, BSN, Scientist, clinical (histocompatibility and immunogenetics) Only

## 2024-07-30 NOTE — TOC CAGE-AID Note (Signed)
 Transition of Care [facility]) - CAGE-AID Screening   Patient Details  Name: Copeland Lapier MRN: [id number] Date of Birth: [birth year]  [facility]) CM/SW Contact:    Landry DELENA Senters, RN Phone Number: 07/30/2024, 12:50 PM   Clinical Narrative: Patient reports past use of drugs and alcohol, has a sobriety date in 2022. No longer uses drugs or alcohol, refuses the need for inpatient or outpatient counseling.    CAGE-AID Screening: Substance Abuse Screening unable to be completed due to: : Patient Refused  Have You Ever Felt You Ought to Cut Down on Your Drinking or Drug Use?: No Have People Annoyed You By Critizing Your Drinking Or Drug Use?: No Have You Felt Bad Or Guilty About Your Drinking Or Drug Use?: No Have You Ever Had a Drink or Used Drugs First Thing In The Morning to Steady Your Nerves or to Get Rid of a Hangover?: No CAGE-AID Score: 0  Substance Abuse Education Offered: No

## 2024-07-30 NOTE — Progress Notes (Signed)
 Doctor in patient's room at 2115 and aware of patient's SBP 221/108, HR = 99. Patient is slightly short of breath at rest and will be going to dialysis soon.

## 2024-07-30 NOTE — TOC Initial Note (Signed)
 Transition of Care [facility]) - Initial/Assessment Note    Patient Details  Name: Kathryn Dillon MRN: [id number] Date of Birth: [birth year]  [facility]) CM/SW Contact:    Landry DELENA Senters, RN Phone Number: 07/30/2024, 12:47 PM  Clinical Narrative:                 RR:ypdunmb of ESRD on dialysis, chronic diastolic heart failure last documented EF is 60% on echocardiogram done in 2018, insulin -dependent DM type II, hypertension, dyslipidemia, COPD, documented history of drug abuse and alcohol abuse denies any use currently.   Patient lives with her brother and niece, reports her niece helps her at home, drives her to dialysis appts, and will be transportation home at d/c.   Patient has PCP, manages medications with niece assistance, DME reviewed-W/C, RW, BSC, shower bench, home O2 at 3L through [place]. Patient confirms family can bring O2 tank for transportation home at discharge.   Patient receives out-pt HD at [facility] on MWF 11:15 am.   Continued medical workup, currently waiting for therapy evals.   CM will continue to follow.  Expected Discharge Plan:  (TBD) Barriers to Discharge: Continued Medical Work up   Patient Goals and CMS Choice            Expected Discharge Plan and Services       Living arrangements for the past 2 months: Apartment                                      Prior Living Arrangements/Services Living arrangements for the past 2 months: Apartment Lives with:: Self, Relatives Patient language and need for interpreter reviewed:: Yes Do you feel safe going back to the place where you live?: Yes      Need for Family Participation in Patient Care: Yes (Comment) Care giver support system in place?: Yes (comment) Current home services: DME (W/C, RW, BSC, shower bench, home O2 at 3L through [employer]) Criminal Activity/Legal Involvement Pertinent to Current Situation/Hospitalization: No - Comment as needed  Activities of Daily  Living   ADL Screening (condition at time of admission) Independently performs ADLs?: Yes (appropriate for developmental age) Is the patient deaf or have difficulty hearing?: No Does the patient have difficulty seeing, even when wearing glasses/contacts?: No Does the patient have difficulty concentrating, remembering, or making decisions?: No  Permission Sought/Granted                  Emotional Assessment Appearance:: Developmentally appropriate Attitude/Demeanor/Rapport: Engaged Affect (typically observed): Calm Orientation: : Oriented to Situation, Oriented to  Time, Oriented to Place, Oriented to Self Alcohol / Substance Use: Not Applicable Psych Involvement: No (comment)  Admission diagnosis:  Shortness of breath [R06.02] Hyperkalemia [E87.5] Acute hypoxemic respiratory failure (HCC) [J96.01] Patient Active Problem List   Diagnosis Date Noted   Acute hypoxemic respiratory failure (HCC) 07/29/2024   Sepsis (HCC) 03/22/2017   Shoulder injury, left, subsequent encounter 08/03/2016   Onychocryptosis 03/30/2016   Atherosclerosis of native artery of both lower extremities with intermittent claudication 03/23/2016   Cigarette smoker 01/13/2016   Obesity (BMI 30-39.9) 11/05/2015   Substance abuse in remission (HCC) 11/05/2015   Chronic obstructive pulmonary disease (HCC) 04/27/2015   Diabetes mellitus (HCC) 08/02/2013   Essential hypertension 08/02/2013   Hyperlipidemia 08/02/2013   Coronary artery disease 08/02/2013   PCP:  Catalina Bare, MD Pharmacy:   [id number] - [place],  [place] - [address]OSS [address] [place] [address] Phone: [phone] Fax: [phone]     Social Drivers of Health (SDOH) Social History: SDOH Screenings   Food Insecurity: Food Insecurity Present (07/29/2024)  Housing: Low Risk (07/30/2024)  Recent Concern: Housing - Medium Risk (07/08/2024)   Received from [facility]   Transportation Needs: No Transportation Needs (07/30/2024)  Recent Concern: Transportation Needs - Unmet Transportation Needs (07/08/2024)   Received from [facility]  Utilities: Not At Risk (07/30/2024)  Recent Concern: Utilities - Medium Risk (07/08/2024)   Received from [facility]  Financial Resource Strain: Medium Risk (07/08/2024)   Received from [facility]  Social Connections: Unknown (07/08/2024)   Received from [facility]  Tobacco Use: High Risk (07/29/2024)   SDOH Interventions:     Readmission Risk Interventions     No data to display

## 2024-07-30 NOTE — Progress Notes (Addendum)
 Kathryn Dillon is an 64 y.o. female history of ESRD on hemodialysis (MWF HD in [place] on [address]), CHF, hypertension, and COPD who presented to the hospital with shortness of breath.  She reported that her ride did not show up today which caused her to miss dialysis.  She states that she is usually on 3 to 4 L of oxygen and was on 5 to 6 L of oxygen per report.    She states that her breathing is now much better.  She denies any sick contacts.  She has had some cough.  She had presented to [facility] ER and then was transferred to [facility] for further management for urgent dialysis.  She had hyperkalemia and received lokelma  as well.    [facility] on [address] 2/3 EDW 96.5kg 3.5hr 350/700 H0, No ESA secondary to malignancy Sensipar  30mg  three times per week Zemplar 2mcg  Assessment/Plan:  # Acute on chronic hypoxic respiratory failure - Secondary to fluid overload.  She missed dialysis Monday after her ride did not show up.  As she was already coming off of a weekend (2 day break), she had no reserve - Optimize volume status with HD; tolerated HD overnight with 3.9L net UF   # End-stage renal disease - HD today per MWF schedule feeling much better after hd overnight; plan next hd Wed if she is still here. - Optimize volume status with HD - She is followed by an outside practice ([facility]).    # Hypertension - Optimize volume status with HD - Team notes that med rec was partially completed - would obtain full med rec when able  - resume home regimen per team; UF will help as well    # Hyperkalemia - s/p lokelma  and tolerated HD 1/26. - She was currently ordered to be NPO.  Given her visibly loose tooth may be best to use mechanical soft once food is resumed-defer to primary team.  Low potassium diet please as well and may need scheduled lokelma  on non-HD days while the tooth issue is ongoing.      # Anemia of CKD - No acute indication for PRBC's - No ESA with  h/o malignancy   # Metabolic bone disease - Restart Cinacalcet  30mg  MWF  Subjective: Breathing much better after hd overnight. Denies f/c/n/v/; +appetite and would like to eat.   Chemistry and CBC: Creatinine, Ser  Date/Time Value Ref Range Status  07/30/2024 04:52 AM 6.62 (H) 0.44 - 1.00 mg/dL Final  98/73/7973 97:84 PM 12.00 (H) 0.44 - 1.00 mg/dL Final  93/72/7977 92:74 PM 7.57 (H) 0.44 - 1.00 mg/dL Final  90/77/7981 95:80 AM 1.11 (H) 0.44 - 1.00 mg/dL Final  90/79/7981 95:55 AM 1.28 (H) 0.44 - 1.00 mg/dL Final  90/80/7981 87:68 AM 1.44 (H) 0.44 - 1.00 mg/dL Final  90/83/7982 90:59 AM 1.07 (H) 0.44 - 1.00 mg/dL Final   Recent Labs  Lab 07/29/24 1415 07/30/24 0452  NA 141 137  K 5.9* 4.4  CL 99 92*  CO2 22 28  GLUCOSE 138* 120*  BUN 87* 38*  CREATININE 12.00* 6.62*  CALCIUM  9.8 9.4  PHOS  --  4.7*   Recent Labs  Lab 07/29/24 1415  WBC 9.1  NEUTROABS 7.5  HGB 8.8*  HCT 27.9*  MCV 85.8  PLT 116*   Liver Function Tests: Recent Labs  Lab 07/29/24 1415 07/30/24 0452  AST 15  --   ALT 8  --   ALKPHOS 123  --  BILITOT 0.5  --   PROT 7.4  --   ALBUMIN  4.4 4.2   Recent Labs  Lab 07/29/24 1415  LIPASE 72*   No results for input(s): AMMONIA in the last 168 hours. Cardiac Enzymes: No results for input(s): CKTOTAL, CKMB, CKMBINDEX, TROPONINI in the last 168 hours. Iron Studies: No results for input(s): IRON, TIBC, TRANSFERRIN, FERRITIN in the last 72 hours. PT/INR: @LABRCNTIP (inr:5)  Xrays/Other Studies: ) Results for orders placed or performed during the hospital encounter of 07/29/24 (from the past 48 hours)  Lipase, blood     Status: Abnormal   Collection Time: 07/29/24  2:15 PM  Result Value Ref Range   Lipase 72 (H) 11 - 51 U/L    Comment: Performed at [facility], [address] [place], [place] [address]  Comprehensive metabolic panel     Status: Abnormal   Collection Time: 07/29/24  2:15 PM  Result Value Ref  Range   Sodium 141 135 - 145 mmol/L   Potassium 5.9 (H) 3.5 - 5.1 mmol/L   Chloride 99 98 - 111 mmol/L   CO2 22 22 - 32 mmol/L   Glucose, Bld 138 (H) 70 - 99 mg/dL    Comment: Glucose reference range applies only to samples taken after fasting for at least 8 hours.   BUN 87 (H) 8 - 23 mg/dL   Creatinine, Ser 87.99 (H) 0.44 - 1.00 mg/dL   Calcium  9.8 8.9 - 10.3 mg/dL   Total Protein 7.4 6.5 - 8.1 g/dL   Albumin  4.4 3.5 - 5.0 g/dL   AST 15 15 - 41 U/L   ALT 8 0 - 44 U/L   Alkaline Phosphatase 123 38 - 126 U/L   Total Bilirubin 0.5 0.0 - 1.2 mg/dL   GFR, Estimated 3 (L) >60 mL/min    Comment: (NOTE) Calculated using the CKD-EPI Creatinine Equation (2021)    Anion gap 20 (H) 5 - 15    Comment: Performed at [facility], [address] [place], [place] [address]  Troponin T, High Sensitivity     Status: Abnormal   Collection Time: 07/29/24  2:15 PM  Result Value Ref Range   Troponin T High Sensitivity 116 (HH) 0 - 19 ng/L    Comment: Critical Value, Read Back and verified with SOPHIE G. RN BY [facility] [id number] AT 1502 (NOTE) Biotin concentrations > 1000 ng/mL falsely decrease TnT results.  Serial cardiac troponin measurements are suggested.  Refer to the Links section for chest pain algorithms and additional  guidance. Performed at [facility], [address] [place], [place] [address]   CBC with Differential     Status: Abnormal   Collection Time: 07/29/24  2:15 PM  Result Value Ref Range   WBC 9.1 4.0 - 10.5 K/uL   RBC 3.25 (L) 3.87 - 5.11 MIL/uL   Hemoglobin 8.8 (L) 12.0 - 15.0 g/dL   HCT 72.0 (L) 63.9 - 53.9 %   MCV 85.8 80.0 - 100.0 fL   MCH 27.1 26.0 - 34.0 pg   MCHC 31.5 30.0 - 36.0 g/dL   RDW 82.7 (H) 88.4 - 84.4 %   Platelets 116 (L) 150 - 400 K/uL   nRBC 0.0 0.0 - 0.2 %   Neutrophils Relative % 83 %   Neutro Abs 7.5 1.7 - 7.7 K/uL   Lymphocytes Relative 7 %   Lymphs Abs 0.7 0.7 - 4.0 K/uL   Monocytes Relative 6 %  Monocytes Absolute 0.5  0.1 - 1.0 K/uL   Eosinophils Relative 4 %   Eosinophils Absolute 0.3 0.0 - 0.5 K/uL   Basophils Relative 0 %   Basophils Absolute 0.0 0.0 - 0.1 K/uL   Immature Granulocytes 0 %   Abs Immature Granulocytes 0.03 0.00 - 0.07 K/uL    Comment: Performed at [facility], [address] [place], [place] [address]  Pro Brain natriuretic peptide     Status: Abnormal   Collection Time: 07/29/24  2:15 PM  Result Value Ref Range   Pro Brain Natriuretic Peptide 16,089.0 (H) <300.0 pg/mL    Comment: (NOTE) Age Group        Cut-Points    Interpretation  < 50 years     450 pg/mL       NT-proBNP > 450 pg/mL indicates                                ADHF is likely              50 to 75 years  900 pg/mL      NT-proBNP > 900 pg/mL indicates          ADHF is likely  > 75 years      1800 pg/mL     NT-proBNP > 1800 pg/mL indicates          ADHF is likely                           All ages    Results between       Indeterminate. Further clinical             300 and the cut-   information is needed to determine            point for age group   if ADHF is present.                                                             Elecsys proBNP II/ Elecsys proBNP II STAT           Cut-Point                       Interpretation  300 pg/mL                    NT-proBNP <300pg/mL indicates                             ADHF is not likely  Performed at [facility], [address] [place], [place] [address]   Resp panel by RT-PCR (RSV, Flu A&B, Covid) Anterior Nasal Swab     Status: None   Collection Time: 07/29/24  2:15 PM   Specimen: Anterior Nasal Swab  Result Value Ref Range   SARS Coronavirus 2 by RT PCR NEGATIVE NEGATIVE    Comment: (NOTE) SARS-CoV-2 target nucleic acids are NOT DETECTED.  The SARS-CoV-2 RNA is generally detectable in upper respiratory specimens during the acute phase of infection. The lowest concentration of SARS-CoV-2 viral copies this assay can  detect  is 138 copies/mL. A negative result does not preclude SARS-Cov-2 infection and should not be used as the sole basis for treatment or other patient management decisions. A negative result may occur with  improper specimen collection/handling, submission of specimen other than nasopharyngeal swab, presence of viral mutation(s) within the areas targeted by this assay, and inadequate number of viral copies(<138 copies/mL). A negative result must be combined with clinical observations, patient history, and epidemiological information. The expected result is Negative.  Fact Sheet for Patients:  bloggercourse.com  Fact Sheet for Healthcare Providers:  seriousbroker.it  This test is no t yet approved or cleared by the [place]  FDA and  has been authorized for detection and/or diagnosis of SARS-CoV-2 by FDA under an Emergency Use Authorization (EUA). This EUA will remain  in effect (meaning this test can be used) for the duration of the COVID-19 declaration under Section 564(b)(1) of the Act, 21 U.S.C.section 360bbb-3(b)(1), unless the authorization is terminated  or revoked sooner.       Influenza A by PCR NEGATIVE NEGATIVE   Influenza B by PCR NEGATIVE NEGATIVE    Comment: (NOTE) The Xpert Xpress SARS-CoV-2/FLU/RSV plus assay is intended as an aid in the diagnosis of influenza from Nasopharyngeal swab specimens and should not be used as a sole basis for treatment. Nasal washings and aspirates are unacceptable for Xpert Xpress SARS-CoV-2/FLU/RSV testing.  Fact Sheet for Patients: bloggercourse.com  Fact Sheet for Healthcare Providers: seriousbroker.it  This test is not yet approved or cleared by the [place]  FDA and has been authorized for detection and/or diagnosis of SARS-CoV-2 by FDA under an Emergency Use Authorization (EUA). This EUA will remain in effect (meaning this  test can be used) for the duration of the COVID-19 declaration under Section 564(b)(1) of the Act, 21 U.S.C. section 360bbb-3(b)(1), unless the authorization is terminated or revoked.     Resp Syncytial Virus by PCR NEGATIVE NEGATIVE    Comment: (NOTE) Fact Sheet for Patients: bloggercourse.com  Fact Sheet for Healthcare Providers: seriousbroker.it  This test is not yet approved or cleared by the [place]  FDA and has been authorized for detection and/or diagnosis of SARS-CoV-2 by FDA under an Emergency Use Authorization (EUA). This EUA will remain in effect (meaning this test can be used) for the duration of the COVID-19 declaration under Section 564(b)(1) of the Act, 21 U.S.C. section 360bbb-3(b)(1), unless the authorization is terminated or revoked.  Performed at [facility], [address] [place], [place] [address]   Glucose, capillary     Status: Abnormal   Collection Time: 07/29/24  7:39 PM  Result Value Ref Range   Glucose-Capillary 164 (H) 70 - 99 mg/dL    Comment: Glucose reference range applies only to samples taken after fasting for at least 8 hours.  Hemoglobin A1c     Status: Abnormal   Collection Time: 07/29/24  7:53 PM  Result Value Ref Range   Hgb A1c MFr Bld 7.9 (H) 4.8 - 5.6 %    Comment: (NOTE) Diagnosis of Diabetes The following HbA1c ranges recommended by the American Diabetes Association (ADA) may be used as an aid in the diagnosis of diabetes mellitus.  Hemoglobin             Suggested A1C NGSP%              Diagnosis  <5.7                   Non Diabetic  5.7-6.4                Pre-Diabetic  >6.4                   Diabetic  <7.0                   Glycemic control for                       adults with diabetes.     Mean Plasma Glucose 180.03 mg/dL    Comment: Performed at [facility], [address], [place], [place] [address]  Hepatitis B surface antigen     Status:  None   Collection Time: 07/29/24  7:53 PM  Result Value Ref Range   Hepatitis B Surface Ag NON REACTIVE NON REACTIVE    Comment: Performed at [facility], [address], [place], [place] [address]  Glucose, capillary     Status: Abnormal   Collection Time: 07/30/24  2:36 AM  Result Value Ref Range   Glucose-Capillary 157 (H) 70 - 99 mg/dL    Comment: Glucose reference range applies only to samples taken after fasting for at least 8 hours.  Renal function panel     Status: Abnormal   Collection Time: 07/30/24  4:52 AM  Result Value Ref Range   Sodium 137 135 - 145 mmol/L   Potassium 4.4 3.5 - 5.1 mmol/L   Chloride 92 (L) 98 - 111 mmol/L   CO2 28 22 - 32 mmol/L   Glucose, Bld 120 (H) 70 - 99 mg/dL    Comment: Glucose reference range applies only to samples taken after fasting for at least 8 hours.   BUN 38 (H) 8 - 23 mg/dL   Creatinine, Ser 3.37 (H) 0.44 - 1.00 mg/dL   Calcium  9.4 8.9 - 10.3 mg/dL   Phosphorus 4.7 (H) 2.5 - 4.6 mg/dL   Albumin  4.2 3.5 - 5.0 g/dL   GFR, Estimated 7 (L) >60 mL/min    Comment: (NOTE) Calculated using the CKD-EPI Creatinine Equation (2021)    Anion gap 17 (H) 5 - 15    Comment: Performed at [facility], [address] [place], [place] [address]   DG Chest Portable 1 View Result Date: 07/29/2024 CLINICAL DATA:  Shortness of breath.  Missed hemodialysis today. EXAM: PORTABLE CHEST 1 VIEW COMPARISON:  Chest radiographs 03/22/2017. FINDINGS: 1418 hours. The heart size and mediastinal contours are stable. Interval increased diffuse interstitial opacities consistent with pulmonary edema. No confluent airspace disease, pneumothorax or significant pleural effusion. Stable degenerative changes in the spine. No acute osseous abnormalities are identified. IMPRESSION: Interval increased diffuse interstitial opacities consistent with pulmonary edema or volume overload. No confluent airspace disease. Electronically Signed   By: Elsie Perone M.D.   On:  07/29/2024 14:36    PMH:   Past Medical History:  Diagnosis Date   CHF (congestive heart failure) (HCC)    COPD (chronic obstructive pulmonary disease) with chronic bronchitis (HCC)    Diabetes mellitus without complication (HCC)    H/O drug abuse (HCC)    H/O ETOH abuse    High cholesterol    Hypertension    PAD (peripheral artery disease)    Renal disorder     PSH:  History reviewed. No pertinent surgical history.  Allergies: Allergies[1]  Medications:   Prior to Admission medications  Medication Sig Start Date End Date Taking? Authorizing Provider  aspirin  EC 81 MG EC tablet Take 1 tablet (81 mg total) by mouth daily. 03/26/17  Yes Gherghe, Costin M, MD  HYDROcodone -acetaminophen  (NORCO/VICODIN) 5-325 MG tablet Take 1 tablet by mouth every 6 (six) hours as needed for severe pain. 03/26/17  Yes Trixie Nilda HERO, MD  insulin  NPH-regular Human (HUMULIN  70/30) (70-30) 100 UNIT/ML injection Inject 80 Units into the skin 2 (two) times daily with a meal.   Yes [provider]  OXYGEN Inhale into the lungs as needed. 2L [place] PRN   Yes [provider]  saccharomyces boulardii (FLORASTOR) 250 MG capsule Take 1 capsule (250 mg total) by mouth 2 (two) times daily. 03/26/17  Yes Gherghe, Costin M, MD    Discontinued Meds:   Medications Discontinued During This Encounter  Medication Reason   hydrALAZINE  (APRESOLINE ) injection 10 mg     Social History:  reports that she has been smoking cigarettes. She has never used smokeless tobacco. She reports that she does not drink alcohol and does not use drugs.  Family History:  History reviewed. No pertinent family history.  Blood pressure (!) 197/97, pulse (!) 110, temperature (!) 97.4 F (36.3 C), temperature source Oral, resp. rate 18, height 5' 4 (1.626 m), weight 99 kg, SpO2 98%. Physical Exam: General: Elderly female in bed in no acute distress at rest on O2 HEENT: NCAT Eyes: EOMI sclera anicteric Neck: Supple trachea  midline Heart: S1-S2 no rub Lungs: Occasional coarse breath sounds, normal rate, on 4 L of oxygen Abdomen: soft/nt/obese habitus Extremities: no edema appreciated; no cyanosis or clubbing Skin: No rash on extremities exposed Neuro: Alert and conversant, follows commands. Access left arm graft with bruit     Bonni Neuser, LYNWOOD ORN, MD 07/30/2024, 7:38 AM      [1]  Allergies Allergen Reactions   Lisinopril Cough

## 2024-07-30 NOTE — Evaluation (Signed)
 Occupational Therapy Evaluation Patient Details Name: Kathryn Dillon MRN: [id number] DOB: 10/01/1960 Today's Date: 07/30/2024   History of Present Illness   64 y.o. female presents 07/29/24 with shortness of breath after missing dialysis that date. Chest x-ray significant for pulmonary edema. PMH: ESRD on dialysis, chronic diastolic heart failure last documented EF is 60% on echocardiogram done in 2018, insulin -dependent DM type II, hypertension, dyslipidemia, COPD, documented history of drug abuse and alcohol abuse denies any use currently.  `     Clinical Impressions PTA Pt reports she received assistance from brother and niece to complete functional transfers to wheelchair, as well as to complete ADL tasks. Pt currently requires up to CGA for functional mobility and up to Max A for ADL engagement. Pt primarily limited by generalized weakness, decreased activity tolerance, and unsteadiness on feet. OT to continue to follow Pt acutely. Anticipate that with acute therapies, Pt can return home without OT follow up once medically cleared for d/c.      If plan is discharge home, recommend the following:   A little help with walking and/or transfers;A lot of help with bathing/dressing/bathroom;Assistance with cooking/housework;Direct supervision/assist for medications management;Direct supervision/assist for financial management;Assist for transportation;Help with stairs or ramp for entrance     Functional Status Assessment   Patient has had a recent decline in their functional status and demonstrates the ability to make significant improvements in function in a reasonable and predictable amount of time.     Equipment Recommendations   None recommended by OT     Recommendations for Other Services         Precautions/Restrictions   Precautions Precautions: Fall Recall of Precautions/Restrictions: Intact Precaution/Restrictions Comments: watch BP and O2 Restrictions Weight  Bearing Restrictions Per Provider Order: No     Mobility Bed Mobility Overal bed mobility: Needs Assistance Bed Mobility: Supine to Sit, Sit to Supine     Supine to sit: Contact guard Sit to supine: Contact guard assist   General bed mobility comments: CGA for safety to engage in bed mobility and verbal cues to sequence task to scoot to EOB and to reposition hips in bed.    Transfers Overall transfer level: Needs assistance Equipment used: Rolling walker (2 wheels) Transfers: Sit to/from Stand Sit to Stand: Contact guard assist           General transfer comment: CGA for sit to stand with RW and bed slightly elevated. Pt with self directed rocking to initiate forward movement to stand. Able to take steps to the R towards [facility] with light assistance to manage RW.      Balance Overall balance assessment: Needs assistance Sitting-balance support: No upper extremity supported, Feet supported Sitting balance-Leahy Scale: Fair     Standing balance support: Bilateral upper extremity supported, During functional activity, Reliant on assistive device for balance Standing balance-Leahy Scale: Poor Standing balance comment: Dependent on RW                           ADL either performed or assessed with clinical judgement   ADL Overall ADL's : Needs assistance/impaired Eating/Feeding: Set up;Sitting   Grooming: Set up;Sitting   Upper Body Bathing: Minimal assistance   Lower Body Bathing: Maximal assistance   Upper Body Dressing : Minimal assistance   Lower Body Dressing: Maximal assistance   Toilet Transfer: Contact guard assist;Stand-pivot;BSC/3in1;Rolling walker (2 wheels)   Toileting- Clothing Manipulation and Hygiene: Moderate assistance  Vision Patient Visual Report: No change from baseline Vision Assessment?: No apparent visual deficits     Perception         Praxis         Pertinent Vitals/Pain Pain Assessment Pain  Assessment: No/denies pain     Extremity/Trunk Assessment Upper Extremity Assessment Upper Extremity Assessment: Generalized weakness   Lower Extremity Assessment Lower Extremity Assessment: Defer to PT evaluation       Communication Communication Communication: No apparent difficulties   Cognition Arousal: Alert Behavior During Therapy: WFL for tasks assessed/performed Cognition: No apparent impairments                               Following commands: Intact       Cueing  General Comments   Cueing Techniques: Verbal cues;Visual cues  VSS throughout session on 4L [place]. Pt reports she feels that she is near baseline.   Exercises     Shoulder Instructions      Home Living Family/patient expects to be discharged to:: Private residence Living Arrangements: Other relatives (Brother and niece) Available Help at Discharge: Family;Available 24 hours/day Type of Home: Apartment Home Access: Level entry     Home Layout: One level     Bathroom Shower/Tub: Chief Strategy Officer: Standard     Home Equipment: Agricultural Consultant (2 wheels);Tub bench;BSC/3in1;Grab bars - toilet;Grab bars - tub/shower;Wheelchair - manual;Other (comment) (Home O2)          Prior Functioning/Environment Prior Level of Function : Needs assist             Mobility Comments: Brother assists her into her wheelchair using step pivot, she reports she mobilizes in wheelchair independently ADLs Comments: Receives assistance from niece to complete BADLs and IADLs.    OT Problem List: Decreased strength;Decreased activity tolerance;Impaired balance (sitting and/or standing);Decreased knowledge of use of DME or AE;Decreased knowledge of precautions   OT Treatment/Interventions: Self-care/ADL training;Therapeutic exercise;Energy conservation;DME and/or AE instruction;Therapeutic activities;Patient/family education;Balance training      OT Goals(Current goals can be found  in the care plan section)   Acute Rehab OT Goals Patient Stated Goal: to get home OT Goal Formulation: With patient Time For Goal Achievement: 08/13/24 Potential to Achieve Goals: Good ADL Goals Pt Will Perform Grooming: with supervision;standing Pt Will Perform Lower Body Dressing: with min assist;sit to/from stand Pt Will Transfer to Toilet: with supervision;bedside commode Additional ADL Goal #1: Pt will verbalize 2 EC strategies to be independently implemented into ADL routine.   OT Frequency:  Min 1X/week    Co-evaluation              AM-PAC OT 6 Clicks Daily Activity     Outcome Measure Help from another person eating meals?: A Little Help from another person taking care of personal grooming?: A Little Help from another person toileting, which includes using toliet, bedpan, or urinal?: A Lot Help from another person bathing (including washing, rinsing, drying)?: A Lot Help from another person to put on and taking off regular upper body clothing?: A Little Help from another person to put on and taking off regular lower body clothing?: A Lot 6 Click Score: 15   End of Session Equipment Utilized During Treatment: Rolling walker (2 wheels)  Activity Tolerance: Patient tolerated treatment well Patient left: in bed;with call bell/phone within reach;with bed alarm set  OT Visit Diagnosis: Unsteadiness on feet (R26.81);Muscle weakness (generalized) (M62.81)  Time: 8493-8476 OT Time Calculation (min): 17 min Charges:  OT General Charges $OT Visit: 1 Visit OT Evaluation $OT Eval Low Complexity: 1 Low  Maurilio CROME, OTR/L.  [facility]  Office: [phone]   Maurilio PARAS Tymeka Privette 07/30/2024, 3:54 PM

## 2024-07-30 NOTE — Progress Notes (Signed)
" °   07/30/24 0049  Vitals  Temp 98.6 F (37 C)  Temp Source Oral  BP 122/86  MAP (mmHg) 99  BP Location Right Arm  BP Method Automatic  Patient Position (if appropriate) Lying  Pulse Rate (!) 116  ECG Heart Rate (!) 116  During Treatment Monitoring  Blood Flow Rate (mL/min) 360 mL/min  Arterial Pressure (mmHg) -29.89 mmHg  Venous Pressure (mmHg) 244.23 mmHg  TMP (mmHg) 40.4 mmHg  Ultrafiltration Rate (mL/min) 1368 mL/min  Dialysate Flow Rate (mL/min) 300 ml/min  Duration of HD Treatment -hour(s) 3.68 hour(s)  Cumulative Fluid Removed (mL) per Treatment  3907.03  HD Safety Checks Performed Yes  Intra-Hemodialysis Comments Tx completed  Post Treatment  Dialyzer Clearance Clear  Liters Processed 84  Fluid Removed (mL) 3900 mL  Tolerated HD Treatment Yes  AVG/AVF Arterial Site Held (minutes) 12 minutes  AVG/AVF Venous Site Held (minutes) 10 minutes    "

## 2024-07-30 NOTE — Progress Notes (Signed)
 Patient reported having no food insecurities. Resources not needed.

## 2024-07-30 NOTE — Plan of Care (Signed)
 Patient is progressing towards goals of care.     Problem: Education: Goal: Ability to describe self-care measures that may prevent or decrease complications (Diabetes Survival Skills Education) will improve Outcome: Progressing Goal: Individualized Educational Video(s) Outcome: Progressing   Problem: Coping: Goal: Ability to adjust to condition or change in health will improve Outcome: Progressing   Problem: Fluid Volume: Goal: Ability to maintain a balanced intake and output will improve Outcome: Progressing   Problem: Health Behavior/Discharge Planning: Goal: Ability to identify and utilize available resources and services will improve Outcome: Progressing Goal: Ability to manage health-related needs will improve Outcome: Progressing   Problem: Metabolic: Goal: Ability to maintain appropriate glucose levels will improve Outcome: Progressing   Problem: Nutritional: Goal: Maintenance of adequate nutrition will improve Outcome: Progressing Goal: Progress toward achieving an optimal weight will improve Outcome: Progressing   Problem: Skin Integrity: Goal: Risk for impaired skin integrity will decrease Outcome: Progressing   Problem: Tissue Perfusion: Goal: Adequacy of tissue perfusion will improve Outcome: Progressing   Problem: Education: Goal: Knowledge of General Education information will improve Description: Including pain rating scale, medication(s)/side effects and non-pharmacologic comfort measures Outcome: Progressing   Problem: Health Behavior/Discharge Planning: Goal: Ability to manage health-related needs will improve Outcome: Progressing   Problem: Clinical Measurements: Goal: Ability to maintain clinical measurements within normal limits will improve Outcome: Progressing Goal: Will remain free from infection Outcome: Progressing Goal: Diagnostic test results will improve Outcome: Progressing Goal: Respiratory complications will improve Outcome:  Progressing Goal: Cardiovascular complication will be avoided Outcome: Progressing   Problem: Activity: Goal: Risk for activity intolerance will decrease Outcome: Progressing   Problem: Nutrition: Goal: Adequate nutrition will be maintained Outcome: Progressing   Problem: Coping: Goal: Level of anxiety will decrease Outcome: Progressing   Problem: Elimination: Goal: Will not experience complications related to bowel motility Outcome: Progressing Goal: Will not experience complications related to urinary retention Outcome: Progressing   Problem: Pain Managment: Goal: General experience of comfort will improve and/or be controlled Outcome: Progressing   Problem: Safety: Goal: Ability to remain free from injury will improve Outcome: Progressing   Problem: Skin Integrity: Goal: Risk for impaired skin integrity will decrease Outcome: Progressing

## 2024-07-31 ENCOUNTER — Other Ambulatory Visit (HOSPITAL_COMMUNITY): Payer: Self-pay

## 2024-07-31 LAB — BASIC METABOLIC PANEL WITH GFR
Anion gap: 18 — ABNORMAL HIGH (ref 5–15)
BUN: 66 mg/dL — ABNORMAL HIGH (ref 8–23)
CO2: 28 mmol/L (ref 22–32)
Calcium: 8.9 mg/dL (ref 8.9–10.3)
Chloride: 90 mmol/L — ABNORMAL LOW (ref 98–111)
Creatinine, Ser: 8.73 mg/dL — ABNORMAL HIGH (ref 0.44–1.00)
GFR, Estimated: 5 mL/min — ABNORMAL LOW
Glucose, Bld: 77 mg/dL (ref 70–99)
Potassium: 5 mmol/L (ref 3.5–5.1)
Sodium: 135 mmol/L (ref 135–145)

## 2024-07-31 LAB — GLUCOSE, CAPILLARY
Glucose-Capillary: 61 mg/dL — ABNORMAL LOW (ref 70–99)
Glucose-Capillary: 93 mg/dL (ref 70–99)
Glucose-Capillary: 146 mg/dL — ABNORMAL HIGH (ref 70–99)
Glucose-Capillary: 82 mg/dL (ref 70–99)

## 2024-07-31 LAB — MISC LABCORP TEST (SEND OUT): Labcorp test code: 83935

## 2024-07-31 LAB — CBC
HCT: 25.7 % — ABNORMAL LOW (ref 36.0–46.0)
Hemoglobin: 8.1 g/dL — ABNORMAL LOW (ref 12.0–15.0)
MCH: 27.1 pg (ref 26.0–34.0)
MCHC: 31.5 g/dL (ref 30.0–36.0)
MCV: 86 fL (ref 80.0–100.0)
Platelets: 149 10*3/uL — ABNORMAL LOW (ref 150–400)
RBC: 2.99 MIL/uL — ABNORMAL LOW (ref 3.87–5.11)
RDW: 16.9 % — ABNORMAL HIGH (ref 11.5–15.5)
WBC: 6.7 10*3/uL (ref 4.0–10.5)
nRBC: 0 % (ref 0.0–0.2)

## 2024-07-31 LAB — HEPATITIS B SURFACE ANTIBODY, QUANTITATIVE: Hep B S AB Quant (Post): 12.2 m[IU]/mL

## 2024-07-31 LAB — TYPE AND SCREEN
ABO/RH(D): A POS
Antibody Screen: NEGATIVE

## 2024-07-31 MED ORDER — LIDOCAINE-PRILOCAINE 2.5-2.5 % EX CREA: 1.0000 | TOPICAL_CREAM | CUTANEOUS | Status: DC | PRN

## 2024-07-31 MED ORDER — HEPARIN SODIUM (PORCINE) 1000 UNIT/ML DIALYSIS: 1000.0000 [IU] | INTRAMUSCULAR | Status: DC | PRN

## 2024-07-31 MED ORDER — LIDOCAINE HCL (PF) 1 % IJ SOLN: 5.0000 mL | INTRAMUSCULAR | Status: DC | PRN

## 2024-07-31 MED ORDER — ANTICOAGULANT SODIUM CITRATE 4% (200MG/5ML) IV SOLN
5.0000 mL | Status: DC | PRN
  Filled 2024-07-31: qty 5

## 2024-07-31 MED ORDER — HUMULIN 70/30 (70-30) 100 UNIT/ML ~~LOC~~ SUSP: 25.0000 [IU] | Freq: Two times a day (BID) | SUBCUTANEOUS | 11 refills | Status: AC

## 2024-07-31 MED ORDER — ISOSORBIDE MONONITRATE ER 30 MG PO TB24
30.0000 mg | ORAL_TABLET | Freq: Every day | ORAL | 1 refills | Status: AC
  Filled 2024-07-31: qty 30, 30d supply, fill #0

## 2024-07-31 MED ORDER — ALTEPLASE 2 MG IJ SOLR: 2.0000 mg | Freq: Once | INTRAMUSCULAR | Status: DC | PRN

## 2024-07-31 MED ORDER — PENTAFLUOROPROP-TETRAFLUOROETH EX AERO: 1.0000 | INHALATION_SPRAY | CUTANEOUS | Status: DC | PRN

## 2024-07-31 MED ORDER — HUMULIN 70/30 (70-30) 100 UNIT/ML ~~LOC~~ SUSP
25.0000 [IU] | Freq: Two times a day (BID) | SUBCUTANEOUS | 11 refills | Status: DC
  Filled 2024-07-31: qty 10, 20d supply, fill #0

## 2024-07-31 MED ORDER — CARVEDILOL 25 MG PO TABS
25.0000 mg | ORAL_TABLET | Freq: Two times a day (BID) | ORAL | 0 refills | Status: AC
  Filled 2024-07-31: qty 60, 30d supply, fill #0

## 2024-07-31 NOTE — TOC Transition Note (Signed)
 Transition of Care [facility]) - Discharge Note   Patient Details  Name: Kathryn Dillon MRN: [id number] Date of Birth: 08-Apr-1961  Transition of Care [facility] CM/SW Contact:  Landry DELENA Senters, RN Phone Number: 07/31/2024, 12:21 PM   Clinical Narrative:     Patient will be discharging to home today, with family providing transportation.   No needs identified per OT.   MATCH provided to [facility] pharmacy for medications due to patient having Medicaid with restricted coverage.   No further needs identified by CM.  MATCH MEDICATION ASSISTANCE CARD Pharmacies please call: [phone] for claim processing assistance.  Rx BIN: [id number] Rx Group: [id number] Rx PCN: [id number] Relationship Code: 1 Person Code: [id number]  Patient ID (MRN):  [facility]    [id number]   Patient Name: Kathryn Dillon     Patient DOB: 02/15/1961    Discharge Date: 07/31/24    Expiration Date: 08/06/24 (must be filled within 7 days of discharge)      Barriers to Discharge: Continued Medical Work up   Patient Goals and CMS Choice            Discharge Placement                       Discharge Plan and Services Additional resources added to the After Visit Summary for                                       Social Drivers of Health (SDOH) Interventions SDOH Screenings   Food Insecurity: No Food Insecurity (07/30/2024)  Recent Concern: Food Insecurity - Food Insecurity Present (07/29/2024)  Housing: Low Risk (07/30/2024)  Recent Concern: Housing - Medium Risk (07/08/2024)   Received from [facility]  Transportation Needs: No Transportation Needs (07/30/2024)  Recent Concern: Transportation Needs - Unmet Transportation Needs (07/08/2024)   Received from [facility]  Utilities: Not At Risk (07/30/2024)  Recent Concern: Utilities - Medium Risk (07/08/2024)   Received from [facility]  Financial Resource Strain: Medium Risk (07/08/2024)   Received from [facility]  Social Connections: Unknown (07/08/2024)   Received from  [facility]  Tobacco Use: High Risk (07/29/2024)     Readmission Risk Interventions     No data to display

## 2024-07-31 NOTE — Progress Notes (Signed)
 Kathryn Dillon is an 64 y.o. female history of ESRD on hemodialysis (MWF HD in [place] on [address]), CHF, hypertension, and COPD who presented to the hospital with shortness of breath.  She reported that her ride did not show up today which caused her to miss dialysis.  She states that she is usually on 3 to 4 L of oxygen and was on 5 to 6 L of oxygen per report.    She states that her breathing is now much better.  She denies any sick contacts.  She has had some cough.  She had presented to [facility] and then was transferred to [facility] for further management for urgent dialysis.  She had hyperkalemia and received lokelma  as well.    [facility] [address] 2/3 EDW 96.5kg 3.5hr 350/700 H0, No ESA secondary to malignancy Sensipar  30mg  three times per week Zemplar 2mcg  Assessment/Plan:  # Acute on chronic hypoxic respiratory failure - Secondary to fluid overload.  She missed dialysis Monday after her ride did not show up.  As she was already coming off of a weekend (2 day break), she had no reserve - Optimize volume status with HD; tolerated HD Mon night with 3.9L net UF   # End-stage renal disease - HD today per MWF schedule feeling much better after hd overnight; plan next hd Wed if she is still here. - Optimize volume status with HD - She is followed by an outside practice ([facility]).  - Seen on HD through lt AVG 2K bath 3L net UF goal 127/72 tolerating for now   # Hypertension - Optimize volume status with HD - Team notes that med rec was partially completed - would obtain full med rec when able  - resume home regimen per team; UF will help as well    # Hyperkalemia - s/p lokelma  and tolerated HD 1/26. - She was currently ordered to be NPO.  Given her visibly loose tooth may be best to use mechanical soft once food is resumed-defer to primary team.  Low potassium diet please as well and may need scheduled lokelma  on non-HD days while the tooth issue is  ongoing.      # Anemia of CKD - No acute indication for PRBC's - No ESA with h/o malignancy   # Metabolic bone disease - Restarted Cinacalcet  30mg  MWF; phos 4.7  Subjective: Breathing much better after hd #1; on HD#2 and tolerating. Denies f/c/n/v/; +appetite    Chemistry and CBC: Creatinine, Ser  Date/Time Value Ref Range Status  07/31/2024 12:35 AM 8.73 (H) 0.44 - 1.00 mg/dL Final  98/72/7973 91:68 AM 6.80 (H) 0.44 - 1.00 mg/dL Final  98/72/7973 95:47 AM 6.62 (H) 0.44 - 1.00 mg/dL Final  98/73/7973 97:84 PM 12.00 (H) 0.44 - 1.00 mg/dL Final  93/72/7977 92:74 PM 7.57 (H) 0.44 - 1.00 mg/dL Final  90/77/7981 95:80 AM 1.11 (H) 0.44 - 1.00 mg/dL Final  90/79/7981 95:55 AM 1.28 (H) 0.44 - 1.00 mg/dL Final  90/80/7981 87:68 AM 1.44 (H) 0.44 - 1.00 mg/dL Final  90/83/7982 90:59 AM 1.07 (H) 0.44 - 1.00 mg/dL Final   Recent Labs  Lab 07/29/24 1415 07/30/24 0452 07/30/24 0831 07/31/24 0035  NA 141 137  --  135  K 5.9* 4.4  --  5.0  CL 99 92*  --  90*  CO2 22 28  --  28  GLUCOSE 138* 120*  --  77  BUN 87* 38*  --  66*  CREATININE 12.00* 6.62* 6.80* 8.73*  CALCIUM  9.8 9.4  --  8.9  PHOS  --  4.7*  --   --    Recent Labs  Lab 07/29/24 1415 07/30/24 0831 07/31/24 0035  WBC 9.1 6.3 6.7  NEUTROABS 7.5  --   --   HGB 8.8* 7.9* 8.1*  HCT 27.9* 25.2* 25.7*  MCV 85.8 84.6 86.0  PLT 116* 119* 149*   Liver Function Tests: Recent Labs  Lab 07/29/24 1415 07/30/24 0452  AST 15  --   ALT 8  --   ALKPHOS 123  --   BILITOT 0.5  --   PROT 7.4  --   ALBUMIN  4.4 4.2   Recent Labs  Lab 07/29/24 1415  LIPASE 72*   No results for input(s): AMMONIA in the last 168 hours. Cardiac Enzymes: No results for input(s): CKTOTAL, CKMB, CKMBINDEX, TROPONINI in the last 168 hours. Iron Studies: No results for input(s): IRON, TIBC, TRANSFERRIN, FERRITIN in the last 72 hours. PT/INR: @LABRCNTIP (inr:5)  Xrays/Other Studies: ) Results for orders placed or performed  during the hospital encounter of 07/29/24 (from the past 48 hours)  Lipase, blood     Status: Abnormal   Collection Time: 07/29/24  2:15 PM  Result Value Ref Range   Lipase 72 (H) 11 - 51 U/L    Comment: Performed at [facility], [address] [place], [place] [address]  Comprehensive metabolic panel     Status: Abnormal   Collection Time: 07/29/24  2:15 PM  Result Value Ref Range   Sodium 141 135 - 145 mmol/L   Potassium 5.9 (H) 3.5 - 5.1 mmol/L   Chloride 99 98 - 111 mmol/L   CO2 22 22 - 32 mmol/L   Glucose, Bld 138 (H) 70 - 99 mg/dL    Comment: Glucose reference range applies only to samples taken after fasting for at least 8 hours.   BUN 87 (H) 8 - 23 mg/dL   Creatinine, Ser 87.99 (H) 0.44 - 1.00 mg/dL   Calcium  9.8 8.9 - 10.3 mg/dL   Total Protein 7.4 6.5 - 8.1 g/dL   Albumin  4.4 3.5 - 5.0 g/dL   AST 15 15 - 41 U/L   ALT 8 0 - 44 U/L   Alkaline Phosphatase 123 38 - 126 U/L   Total Bilirubin 0.5 0.0 - 1.2 mg/dL   GFR, Estimated 3 (L) >60 mL/min    Comment: (NOTE) Calculated using the CKD-EPI Creatinine Equation (2021)    Anion gap 20 (H) 5 - 15    Comment: Performed at [facility], [address] [place], [place] [address]  Troponin T, High Sensitivity     Status: Abnormal   Collection Time: 07/29/24  2:15 PM  Result Value Ref Range   Troponin T High Sensitivity 116 (HH) 0 - 19 ng/L    Comment: Critical Value, Read Back and verified with SOPHIE G. RN BY [facility] [id number] AT 1502 (NOTE) Biotin concentrations > 1000 ng/mL falsely decrease TnT results.  Serial cardiac troponin measurements are suggested.  Refer to the Links section for chest pain algorithms and additional  guidance. Performed at [facility], [address] [place], [place] [address]   CBC with Differential     Status: Abnormal   Collection Time: 07/29/24  2:15 PM  Result Value Ref Range   WBC 9.1 4.0 - 10.5 K/uL   RBC 3.25 (L) 3.87 - 5.11 MIL/uL   Hemoglobin 8.8 (  L)  12.0 - 15.0 g/dL   HCT 72.0 (L) 63.9 - 53.9 %   MCV 85.8 80.0 - 100.0 fL   MCH 27.1 26.0 - 34.0 pg   MCHC 31.5 30.0 - 36.0 g/dL   RDW 82.7 (H) 88.4 - 84.4 %   Platelets 116 (L) 150 - 400 K/uL   nRBC 0.0 0.0 - 0.2 %   Neutrophils Relative % 83 %   Neutro Abs 7.5 1.7 - 7.7 K/uL   Lymphocytes Relative 7 %   Lymphs Abs 0.7 0.7 - 4.0 K/uL   Monocytes Relative 6 %   Monocytes Absolute 0.5 0.1 - 1.0 K/uL   Eosinophils Relative 4 %   Eosinophils Absolute 0.3 0.0 - 0.5 K/uL   Basophils Relative 0 %   Basophils Absolute 0.0 0.0 - 0.1 K/uL   Immature Granulocytes 0 %   Abs Immature Granulocytes 0.03 0.00 - 0.07 K/uL    Comment: Performed at [facility], [address] [place], [place] [address]  Pro Brain natriuretic peptide     Status: Abnormal   Collection Time: 07/29/24  2:15 PM  Result Value Ref Range   Pro Brain Natriuretic Peptide 16,089.0 (H) <300.0 pg/mL    Comment: (NOTE) Age Group        Cut-Points    Interpretation  < 50 years     450 pg/mL       NT-proBNP > 450 pg/mL indicates                                ADHF is likely              50 to 75 years  900 pg/mL      NT-proBNP > 900 pg/mL indicates          ADHF is likely  > 75 years      1800 pg/mL     NT-proBNP > 1800 pg/mL indicates          ADHF is likely                           All ages    Results between       Indeterminate. Further clinical             300 and the cut-   information is needed to determine            point for age group   if ADHF is present.                                                             Elecsys proBNP II/ Elecsys proBNP II STAT           Cut-Point                       Interpretation  300 pg/mL                    NT-proBNP <300pg/mL indicates                             ADHF is not  likely  Performed at [facility], [address] [place], [place] [address]   Resp panel by RT-PCR (RSV, Flu A&B, Covid) Anterior Nasal Swab     Status: None   Collection  Time: 07/29/24  2:15 PM   Specimen: Anterior Nasal Swab  Result Value Ref Range   SARS Coronavirus 2 by RT PCR NEGATIVE NEGATIVE    Comment: (NOTE) SARS-CoV-2 target nucleic acids are NOT DETECTED.  The SARS-CoV-2 RNA is generally detectable in upper respiratory specimens during the acute phase of infection. The lowest concentration of SARS-CoV-2 viral copies this assay can detect is 138 copies/mL. A negative result does not preclude SARS-Cov-2 infection and should not be used as the sole basis for treatment or other patient management decisions. A negative result may occur with  improper specimen collection/handling, submission of specimen other than nasopharyngeal swab, presence of viral mutation(s) within the areas targeted by this assay, and inadequate number of viral copies(<138 copies/mL). A negative result must be combined with clinical observations, patient history, and epidemiological information. The expected result is Negative.  Fact Sheet for Patients:  bloggercourse.com  Fact Sheet for Healthcare Providers:  seriousbroker.it  This test is no t yet approved or cleared by the [place]  FDA and  has been authorized for detection and/or diagnosis of SARS-CoV-2 by FDA under an Emergency Use Authorization (EUA). This EUA will remain  in effect (meaning this test can be used) for the duration of the COVID-19 declaration under Section 564(b)(1) of the Act, 21 U.S.C.section 360bbb-3(b)(1), unless the authorization is terminated  or revoked sooner.       Influenza A by PCR NEGATIVE NEGATIVE   Influenza B by PCR NEGATIVE NEGATIVE    Comment: (NOTE) The Xpert Xpress SARS-CoV-2/FLU/RSV plus assay is intended as an aid in the diagnosis of influenza from Nasopharyngeal swab specimens and should not be used as a sole basis for treatment. Nasal washings and aspirates are unacceptable for Xpert Xpress  SARS-CoV-2/FLU/RSV testing.  Fact Sheet for Patients: bloggercourse.com  Fact Sheet for Healthcare Providers: seriousbroker.it  This test is not yet approved or cleared by the [place]  FDA and has been authorized for detection and/or diagnosis of SARS-CoV-2 by FDA under an Emergency Use Authorization (EUA). This EUA will remain in effect (meaning this test can be used) for the duration of the COVID-19 declaration under Section 564(b)(1) of the Act, 21 U.S.C. section 360bbb-3(b)(1), unless the authorization is terminated or revoked.     Resp Syncytial Virus by PCR NEGATIVE NEGATIVE    Comment: (NOTE) Fact Sheet for Patients: bloggercourse.com  Fact Sheet for Healthcare Providers: seriousbroker.it  This test is not yet approved or cleared by the [place]  FDA and has been authorized for detection and/or diagnosis of SARS-CoV-2 by FDA under an Emergency Use Authorization (EUA). This EUA will remain in effect (meaning this test can be used) for the duration of the COVID-19 declaration under Section 564(b)(1) of the Act, 21 U.S.C. section 360bbb-3(b)(1), unless the authorization is terminated or revoked.  Performed at [facility], [address] [place], [place] [address]   Hemoglobin A1c     Status: Abnormal   Collection Time: 07/29/24  4:35 PM  Result Value Ref Range   Hgb A1c MFr Bld 7.9 (H) 4.8 - 5.6 %    Comment: (NOTE) Diagnosis of Diabetes The following HbA1c ranges recommended by the American Diabetes Association (ADA) may be used as an aid in the diagnosis of diabetes mellitus.  Hemoglobin             Suggested A1C NGSP%              Diagnosis  <5.7                   Non Diabetic  5.7-6.4                Pre-Diabetic  >6.4                   Diabetic  <7.0                   Glycemic control for                       adults with  diabetes.     Mean Plasma Glucose 180.03 mg/dL    Comment: Performed at [facility], [address], [place], [place] [address]  Hepatitis B surface antigen     Status: None   Collection Time: 07/29/24  5:47 PM  Result Value Ref Range   Hepatitis B Surface Ag NON REACTIVE NON REACTIVE    Comment: Performed at [facility], [address], [place], [place] [address]  Glucose, capillary     Status: Abnormal   Collection Time: 07/29/24  7:39 PM  Result Value Ref Range   Glucose-Capillary 164 (H) 70 - 99 mg/dL    Comment: Glucose reference range applies only to samples taken after fasting for at least 8 hours.  Hemoglobin A1c     Status: Abnormal   Collection Time: 07/29/24  7:53 PM  Result Value Ref Range   Hgb A1c MFr Bld 7.9 (H) 4.8 - 5.6 %    Comment: (NOTE) Diagnosis of Diabetes The following HbA1c ranges recommended by the American Diabetes Association (ADA) may be used as an aid in the diagnosis of diabetes mellitus.  Hemoglobin             Suggested A1C NGSP%              Diagnosis  <5.7                   Non Diabetic  5.7-6.4                Pre-Diabetic  >6.4                   Diabetic  <7.0                   Glycemic control for                       adults with diabetes.     Mean Plasma Glucose 180.03 mg/dL    Comment: Performed at [facility], [address], [place], [place] [address]  Hepatitis B surface antibody,quantitative     Status: None   Collection Time: 07/29/24  7:53 PM  Result Value Ref Range   Hep B S AB Quant (Post) 12.2 Immunity>10 mIU/mL    Comment: (NOTE)  Status of Immunity                     Anti-HBs Level  ------------------                     -------------- Inconsistent with Immunity  0.0 - 10.0 Consistent with Immunity                         >10.0 Performed At: [facility] [address] [place], [place] [id number] Jennette Shorter MD Ey:[phone]   Hepatitis B surface antigen     Status:  None   Collection Time: 07/29/24  7:53 PM  Result Value Ref Range   Hepatitis B Surface Ag NON REACTIVE NON REACTIVE    Comment: Performed at [facility], [address] [place], [place] [address]  Glucose, capillary     Status: Abnormal   Collection Time: 07/30/24  2:36 AM  Result Value Ref Range   Glucose-Capillary 157 (H) 70 - 99 mg/dL    Comment: Glucose reference range applies only to samples taken after fasting for at least 8 hours.  Renal function panel     Status: Abnormal   Collection Time: 07/30/24  4:52 AM  Result Value Ref Range   Sodium 137 135 - 145 mmol/L   Potassium 4.4 3.5 - 5.1 mmol/L   Chloride 92 (L) 98 - 111 mmol/L   CO2 28 22 - 32 mmol/L   Glucose, Bld 120 (H) 70 - 99 mg/dL    Comment: Glucose reference range applies only to samples taken after fasting for at least 8 hours.   BUN 38 (H) 8 - 23 mg/dL   Creatinine, Ser 3.37 (H) 0.44 - 1.00 mg/dL   Calcium  9.4 8.9 - 10.3 mg/dL   Phosphorus 4.7 (H) 2.5 - 4.6 mg/dL   Albumin  4.2 3.5 - 5.0 g/dL   GFR, Estimated 7 (L) >60 mL/min    Comment: (NOTE) Calculated using the CKD-EPI Creatinine Equation (2021)    Anion gap 17 (H) 5 - 15    Comment: Performed at [facility], [address], [place], [place] [address]  Glucose, capillary     Status: Abnormal   Collection Time: 07/30/24  7:40 AM  Result Value Ref Range   Glucose-Capillary 106 (H) 70 - 99 mg/dL    Comment: Glucose reference range applies only to samples taken after fasting for at least 8 hours.  CBC     Status: Abnormal   Collection Time: 07/30/24  8:31 AM  Result Value Ref Range   WBC 6.3 4.0 - 10.5 K/uL   RBC 2.98 (L) 3.87 - 5.11 MIL/uL   Hemoglobin 7.9 (L) 12.0 - 15.0 g/dL   HCT 74.7 (L) 63.9 - 53.9 %   MCV 84.6 80.0 - 100.0 fL   MCH 26.5 26.0 - 34.0 pg   MCHC 31.3 30.0 - 36.0 g/dL   RDW 82.8 (H) 88.4 - 84.4 %   Platelets 119 (L) 150 - 400 K/uL   nRBC 0.0 0.0 - 0.2 %    Comment: Performed at [facility], [address] [place], [place] [address]  Creatinine, serum     Status: Abnormal   Collection Time: 07/30/24  8:31 AM  Result Value Ref Range   Creatinine, Ser 6.80 (H) 0.44 - 1.00 mg/dL   GFR, Estimated 6 (L) >60 mL/min    Comment: (NOTE) Calculated using the CKD-EPI Creatinine Equation (2021) Performed at [facility], [address] [place], [place] [address]   Miscellaneous [facility] test (send-out)     Status: None   Collection Time: 07/30/24  8:31 AM  Result Value Ref Range   [facility] test code [id number]    [facility] test  name [id number]    Source ([facility]) SERUM     Comment: Performed at [facility], [address], [place], [place] [address]   Misc [facility] result COMMENT     Comment: (NOTE) Test Ordered: [id number] HIV Ab/p24 Ag with Reflex HIV Ab/p24 Ag Screen           Note:                     BN     Non Reactive                                                  Reference Range: Non Reactive                          HIV-1/HIV-2 antibodies and HIV-1 p24 antigen were NOT detected. There is no laboratory evidence of HIV infection. HIV Negative Performed At: [facility] [address] [place], [place] [id number] Jennette Shorter MD Ey:[phone]   ABO/Rh     Status: None   Collection Time: 07/30/24  8:31 AM  Result Value Ref Range   ABO/RH(D)      A POS Performed at [facility], [address] [place], [place] [address]   Glucose, capillary     Status: Abnormal   Collection Time: 07/30/24 11:39 AM  Result Value Ref Range   Glucose-Capillary 135 (H) 70 - 99 mg/dL    Comment: Glucose reference range applies only to samples taken after fasting for at least 8 hours.  Glucose, capillary     Status: Abnormal   Collection Time: 07/30/24  3:45 PM  Result Value Ref Range   Glucose-Capillary 110 (H) 70 - 99 mg/dL    Comment: Glucose reference range applies only to samples taken after fasting for at least 8 hours.  Glucose, capillary     Status: None   Collection Time:  07/30/24  8:31 PM  Result Value Ref Range   Glucose-Capillary 84 70 - 99 mg/dL    Comment: Glucose reference range applies only to samples taken after fasting for at least 8 hours.  Basic metabolic panel     Status: Abnormal   Collection Time: 07/31/24 12:35 AM  Result Value Ref Range   Sodium 135 135 - 145 mmol/L   Potassium 5.0 3.5 - 5.1 mmol/L   Chloride 90 (L) 98 - 111 mmol/L   CO2 28 22 - 32 mmol/L   Glucose, Bld 77 70 - 99 mg/dL    Comment: Glucose reference range applies only to samples taken after fasting for at least 8 hours.   BUN 66 (H) 8 - 23 mg/dL   Creatinine, Ser 1.26 (H) 0.44 - 1.00 mg/dL   Calcium  8.9 8.9 - 10.3 mg/dL   GFR, Estimated 5 (L) >60 mL/min    Comment: (NOTE) Calculated using the CKD-EPI Creatinine Equation (2021)    Anion gap 18 (H) 5 - 15    Comment: Performed at [facility], [address] [place], [place] [address]  CBC     Status: Abnormal   Collection Time: 07/31/24 12:35 AM  Result Value Ref Range   WBC 6.7 4.0 - 10.5 K/uL   RBC 2.99 (L) 3.87 - 5.11 MIL/uL   Hemoglobin 8.1 (L) 12.0 - 15.0 g/dL  HCT 25.7 (L) 36.0 - 46.0 %   MCV 86.0 80.0 - 100.0 fL   MCH 27.1 26.0 - 34.0 pg   MCHC 31.5 30.0 - 36.0 g/dL   RDW 83.0 (H) 88.4 - 84.4 %   Platelets 149 (L) 150 - 400 K/uL   nRBC 0.0 0.0 - 0.2 %    Comment: Performed at [facility], [address] [place], [place] [address]  Type and screen [facility]     Status: None   Collection Time: 07/31/24 12:35 AM  Result Value Ref Range   ABO/RH(D) A POS    Antibody Screen NEG    Sample Expiration      08/03/2024,2359 Performed at [facility], [address] [place], [place] [address]   Glucose, capillary     Status: Abnormal   Collection Time: 07/31/24  4:21 AM  Result Value Ref Range   Glucose-Capillary 61 (L) 70 - 99 mg/dL    Comment: Glucose reference range applies only to samples taken after fasting for at least 8 hours.  Glucose, capillary     Status:  None   Collection Time: 07/31/24  4:48 AM  Result Value Ref Range   Glucose-Capillary 93 70 - 99 mg/dL    Comment: Glucose reference range applies only to samples taken after fasting for at least 8 hours.  Glucose, capillary     Status: Abnormal   Collection Time: 07/31/24  7:54 AM  Result Value Ref Range   Glucose-Capillary 146 (H) 70 - 99 mg/dL    Comment: Glucose reference range applies only to samples taken after fasting for at least 8 hours.   DG Chest Portable 1 View Result Date: 07/29/2024 CLINICAL DATA:  Shortness of breath.  Missed hemodialysis today. EXAM: PORTABLE CHEST 1 VIEW COMPARISON:  Chest radiographs 03/22/2017. FINDINGS: 1418 hours. The heart size and mediastinal contours are stable. Interval increased diffuse interstitial opacities consistent with pulmonary edema. No confluent airspace disease, pneumothorax or significant pleural effusion. Stable degenerative changes in the spine. No acute osseous abnormalities are identified. IMPRESSION: Interval increased diffuse interstitial opacities consistent with pulmonary edema or volume overload. No confluent airspace disease. Electronically Signed   By: Elsie Perone M.D.   On: 07/29/2024 14:36    PMH:   Past Medical History:  Diagnosis Date   CHF (congestive heart failure) (HCC)    COPD (chronic obstructive pulmonary disease) with chronic bronchitis (HCC)    Diabetes mellitus without complication (HCC)    H/O drug abuse (HCC)    H/O ETOH abuse    High cholesterol    Hypertension    PAD (peripheral artery disease)    Renal disorder     PSH:  History reviewed. No pertinent surgical history.  Allergies: Allergies[1]  Medications:   Prior to Admission medications  Medication Sig Start Date End Date Taking? Authorizing Provider  aspirin  EC 81 MG EC tablet Take 1 tablet (81 mg total) by mouth daily. 03/26/17  Yes Gherghe, Costin M, MD  HYDROcodone -acetaminophen  (NORCO/VICODIN) 5-325 MG tablet Take 1 tablet by mouth every  6 (six) hours as needed for severe pain. 03/26/17  Yes Trixie Nilda HERO, MD  insulin  NPH-regular Human (HUMULIN  70/30) (70-30) 100 UNIT/ML injection Inject 80 Units into the skin 2 (two) times daily with a meal.   Yes [provider]  OXYGEN Inhale into the lungs as needed. 2L [place] PRN   Yes [provider]  saccharomyces boulardii (FLORASTOR) 250 MG capsule Take 1  capsule (250 mg total) by mouth 2 (two) times daily. 03/26/17  Yes Gherghe, Costin M, MD    Discontinued Meds:   Medications Discontinued During This Encounter  Medication Reason   hydrALAZINE  (APRESOLINE ) injection 10 mg    insulin  aspart protamine- aspart (NOVOLOG  MIX 70/30) injection 50 Units     Social History:  reports that she has been smoking cigarettes. She has never used smokeless tobacco. She reports that she does not drink alcohol and does not use drugs.  Family History:  History reviewed. No pertinent family history.  Blood pressure 127/72, pulse 89, temperature 98.4 F (36.9 C), temperature source Oral, resp. rate 13, height 5' 4 (1.626 m), weight 99 kg, SpO2 99%. Physical Exam: General: Elderly female in bed in no acute distress at rest on O2 HEENT: NCAT Eyes: EOMI sclera anicteric Neck: Supple trachea midline Heart: S1-S2 no rub Lungs: Occasional coarse breath sounds, normal rate, on 4 L of oxygen Abdomen: soft/nt/obese habitus Extremities: no edema appreciated; no cyanosis or clubbing Skin: No rash on extremities exposed Neuro: Alert and conversant, follows commands. Access left arm graft with bruit     Izear Pine, LYNWOOD ORN, MD 07/31/2024, 9:57 AM      [1]  Allergies Allergen Reactions   Lisinopril Cough

## 2024-07-31 NOTE — Plan of Care (Signed)
" °  Problem: Education: Goal: Ability to describe self-care measures that may prevent or decrease complications (Diabetes Survival Skills Education) will improve Outcome: Progressing   Problem: Coping: Goal: Ability to adjust to condition or change in health will improve Outcome: Progressing   Problem: Fluid Volume: Goal: Ability to maintain a balanced intake and output will improve Outcome: Progressing   Problem: Health Behavior/Discharge Planning: Goal: Ability to identify and utilize available resources and services will improve Outcome: Progressing Goal: Ability to manage health-related needs will improve Outcome: Progressing   Problem: Metabolic: Goal: Ability to maintain appropriate glucose levels will improve Outcome: Progressing   Problem: Skin Integrity: Goal: Risk for impaired skin integrity will decrease Outcome: Progressing   Problem: Clinical Measurements: Goal: Will remain free from infection Outcome: Progressing Goal: Diagnostic test results will improve Outcome: Progressing Goal: Respiratory complications will improve Outcome: Progressing   "

## 2024-07-31 NOTE — Plan of Care (Signed)
 Patient is progressing towards goals of care.     Problem: Education: Goal: Ability to describe self-care measures that may prevent or decrease complications (Diabetes Survival Skills Education) will improve Outcome: Progressing Goal: Individualized Educational Video(s) Outcome: Progressing   Problem: Coping: Goal: Ability to adjust to condition or change in health will improve Outcome: Progressing   Problem: Fluid Volume: Goal: Ability to maintain a balanced intake and output will improve Outcome: Progressing   Problem: Health Behavior/Discharge Planning: Goal: Ability to identify and utilize available resources and services will improve Outcome: Progressing Goal: Ability to manage health-related needs will improve Outcome: Progressing   Problem: Metabolic: Goal: Ability to maintain appropriate glucose levels will improve Outcome: Progressing   Problem: Nutritional: Goal: Maintenance of adequate nutrition will improve Outcome: Progressing Goal: Progress toward achieving an optimal weight will improve Outcome: Progressing   Problem: Skin Integrity: Goal: Risk for impaired skin integrity will decrease Outcome: Progressing   Problem: Tissue Perfusion: Goal: Adequacy of tissue perfusion will improve Outcome: Progressing   Problem: Education: Goal: Knowledge of General Education information will improve Description: Including pain rating scale, medication(s)/side effects and non-pharmacologic comfort measures Outcome: Progressing   Problem: Health Behavior/Discharge Planning: Goal: Ability to manage health-related needs will improve Outcome: Progressing   Problem: Clinical Measurements: Goal: Ability to maintain clinical measurements within normal limits will improve Outcome: Progressing Goal: Will remain free from infection Outcome: Progressing Goal: Diagnostic test results will improve Outcome: Progressing Goal: Respiratory complications will improve Outcome:  Progressing Goal: Cardiovascular complication will be avoided Outcome: Progressing   Problem: Activity: Goal: Risk for activity intolerance will decrease Outcome: Progressing   Problem: Nutrition: Goal: Adequate nutrition will be maintained Outcome: Progressing   Problem: Coping: Goal: Level of anxiety will decrease Outcome: Progressing   Problem: Elimination: Goal: Will not experience complications related to bowel motility Outcome: Progressing Goal: Will not experience complications related to urinary retention Outcome: Progressing   Problem: Pain Managment: Goal: General experience of comfort will improve and/or be controlled Outcome: Progressing   Problem: Safety: Goal: Ability to remain free from injury will improve Outcome: Progressing   Problem: Skin Integrity: Goal: Risk for impaired skin integrity will decrease Outcome: Progressing

## 2024-07-31 NOTE — Progress Notes (Signed)
 D/C order noted. Contacted [facility] to be advised of pt's d/c today and that pt should resume care on Friday. D/C summary and today's renal note faxed to clinic for continuation of care.   Randine Mungo Dialysis Navigator [phone]

## 2024-07-31 NOTE — Plan of Care (Signed)

## 2024-07-31 NOTE — Discharge Summary (Signed)
 "  PATIENT DETAILS Name: Kathryn Dillon Age: 64 y.o. Sex: female Date of Birth: Mar 17, 1961 MRN: [id number]. Admitting Physician: Lavada MARLA Stank, MD ERE:[id number], Zachary, MD  Admit Date: 07/29/2024 Discharge date: 07/31/2024  Recommendations for Outpatient Follow-up:  Follow up with PCP in 1-2 weeks Please obtain CMP/CBC in one week  Admitted From:  Home  Disposition: Home   Discharge Condition: good  CODE STATUS:   Code Status: Full Code   Diet recommendation:  Diet Order             Diet - low sodium heart healthy           Diet Carb Modified           Diet renal/carb modified with fluid restriction Diet-HS Snack? Nothing; Fluid restriction: 1200 mL Fluid; Room service appropriate? Yes; Fluid consistency: Thin  Diet effective now                    Brief Summary: Patient is a 64 y.o.  female with history of ESRD on HD, chronic HFpEF, DM-2, HTN, HLD, COPD on home O2 2-3 L, PAD-who presented to [facility] with shortness of breath after missed HD (right did not show up due to adverse weather conditions)-she was found to have acute on chronic hypoxic respiratory failure secondary to decompensated diastolic heart failure from missed HD.   Significant events: 1/26>> admit to [facility]   Significant studies: 1/26>> CXR:+ Pulmonary edema   Significant microbiology data: 1/26>> COVID/influenza/RSV PCR: Negative   Procedures: None   Consults: Nephrology  Brief Hospital Course: Acute on chronic hypoxic respiratory failure secondary to HFpEF exacerbation-due to missed HD Significantly better after HD Back on usual 2-3 L of oxygen.   Hypertensive urgency Much better after HD and initiation of antihypertensives Continue amlodipine /Coreg /Imdur    ESRD on HD M[facility] Underwent HD x 2-plans are to discharge home later today after HD. Resume outpatient HD post discharge.   Hyperkalemia Resolved with Lokelma /HD.   Normocytic anemia Secondary to  ESRD Aranesp/iron therapies defer to nephrology service   DM-2 CBG stable. Continue insulin  70/30 on discharge   Chronic debility/deconditioning Uses combination of walker around the house-for longer distances-she uses a wheelchair    Class 2 Obesity: Estimated body mass index is 37.46 kg/m as calculated from the following:   Height as of this encounter: 5' 4 (1.626 m).   Weight as of this encounter: 99 kg.     Discharge Diagnoses:  Principal Problem:   Acute hypoxemic respiratory failure [facility])   Discharge Instructions:  Activity:  As tolerated with Full fall precautions use walker/cane & assistance as needed   Discharge Instructions     Call MD for:  difficulty breathing, headache or visual disturbances   Complete by: As directed    Diet - low sodium heart healthy   Complete by: As directed    Diet Carb Modified   Complete by: As directed    Discharge instructions   Complete by: As directed    Follow with Primary MD  Catalina Zachary, MD in 1-2 weeks  Please get a complete blood count and chemistry panel checked by your Primary MD at your next visit, and again as instructed by your Primary MD.  Get Medicines reviewed and adjusted: Please take all your medications with you for your next visit with your Primary MD  Laboratory/radiological data: Please request your Primary MD to go over all hospital tests and procedure/radiological results at the follow up, please ask your  Primary MD to get all Hospital records sent to his/her office.  In some cases, they will be blood work, cultures and biopsy results pending at the time of your discharge. Please request that your primary care M.D. follows up on these results.  Also Note the following: If you experience worsening of your admission symptoms, develop shortness of breath, life threatening emergency, suicidal or homicidal thoughts you must seek medical attention immediately by calling 911 or calling your MD  immediately  if symptoms less severe.  You must read complete instructions/literature along with all the possible adverse reactions/side effects for all the Medicines you take and that have been prescribed to you. Take any new Medicines after you have completely understood and accpet all the possible adverse reactions/side effects.   Do not drive when taking Pain medications or sleeping medications (Benzodaizepines)  Do not take more than prescribed Pain, Sleep and Anxiety Medications. It is not advisable to combine anxiety,sleep and pain medications without talking with your primary care practitioner  Special Instructions: If you have smoked or chewed Tobacco  in the last 2 yrs please stop smoking, stop any regular Alcohol  and or any Recreational drug use.  Wear Seat belts while driving.  Please note: You were cared for by a hospitalist during your hospital stay. Once you are discharged, your primary care physician will handle any further medical issues. Please note that NO REFILLS for any discharge medications will be authorized once you are discharged, as it is imperative that you return to your primary care physician (or establish a relationship with a primary care physician if you do not have one) for your post hospital discharge needs so that they can reassess your need for medications and monitor your lab values.   Check your blood sugars frequently-if your blood sugars stay increasing-please contact your primary care practitioner for further instructions.  Your blood sugars are currently stable on insulin  70/30 20 units twice daily.   Increase activity slowly   Complete by: As directed    No wound care   Complete by: As directed       Allergies as of 07/31/2024       Reactions   Lisinopril Cough        Medication List     TAKE these medications    amLODipine  10 MG tablet Commonly known as: NORVASC  Take 10 mg by mouth daily.   aspirin  EC 81 MG tablet Take 1 tablet (81  mg total) by mouth daily.   carvedilol  25 MG tablet Commonly known as: COREG  Take 1 tablet (25 mg total) by mouth 2 (two) times daily with a meal.   HumuLIN  70/30 (70-30) 100 UNIT/ML injection Generic drug: insulin  NPH-regular Human Inject 25 Units into the skin 2 (two) times daily with a meal. 25 units twice daily-gradually increase based on your sugars to your usual regimen of 80 units twice daily. What changed:  how much to take additional instructions   HYDROcodone -acetaminophen  5-325 MG tablet Commonly known as: NORCO/VICODIN Take 1 tablet by mouth every 6 (six) hours as needed for severe pain.   isosorbide  mononitrate 30 MG 24 hr tablet Commonly known as: IMDUR  Take 1 tablet (30 mg total) by mouth daily.   OXYGEN Inhale into the lungs as needed. 2L [place] PRN   saccharomyces boulardii 250 MG capsule Commonly known as: FLORASTOR Take 1 capsule (250 mg total) by mouth 2 (two) times daily.        Follow-up Information  Catalina Bare, MD. Schedule an appointment as soon as possible for a visit in 1 week(s).   Specialty: Internal Medicine Contact information: [address] [place] [address] [phone]                Allergies[1]   Other Procedures/Studies: DG Chest Portable 1 View Result Date: 07/29/2024 CLINICAL DATA:  Shortness of breath.  Missed hemodialysis today. EXAM: PORTABLE CHEST 1 VIEW COMPARISON:  Chest radiographs 03/22/2017. FINDINGS: 1418 hours. The heart size and mediastinal contours are stable. Interval increased diffuse interstitial opacities consistent with pulmonary edema. No confluent airspace disease, pneumothorax or significant pleural effusion. Stable degenerative changes in the spine. No acute osseous abnormalities are identified. IMPRESSION: Interval increased diffuse interstitial opacities consistent with pulmonary edema or volume overload. No confluent airspace disease. Electronically Signed   By: Elsie Perone M.D.   On: 07/29/2024 14:36     TODAY-DAY OF DISCHARGE:  Subjective:   Kathryn Dillon today has no headache,no chest abdominal pain,no new weakness tingling or numbness, feels much better wants to go home today.   Objective:   Blood pressure 117/73, pulse 95, temperature 98.4 F (36.9 C), temperature source Oral, resp. rate 15, height 5' 4 (1.626 m), weight 99 kg, SpO2 100%.  Intake/Output Summary (Last 24 hours) at 07/31/2024 1059 Last data filed at 07/31/2024 0259 Gross per 24 hour  Intake 360 ml  Output --  Net 360 ml   Filed Weights   07/29/24 1924  Weight: 99 kg    Exam: Awake Alert, Oriented *3, No new F.N deficits, Normal affect [place].AT,PERRAL Supple Neck,No JVD, No cervical lymphadenopathy appriciated.  Symmetrical Chest wall movement, Good air movement bilaterally, CTAB RRR,No Gallops,Rubs or new Murmurs, No Parasternal Heave +ve B.Sounds, Abd Soft, Non tender, No organomegaly appriciated, No rebound -guarding or rigidity. No Cyanosis, Clubbing or edema, No new Rash or bruise   PERTINENT RADIOLOGIC STUDIES: DG Chest Portable 1 View Result Date: 07/29/2024 CLINICAL DATA:  Shortness of breath.  Missed hemodialysis today. EXAM: PORTABLE CHEST 1 VIEW COMPARISON:  Chest radiographs 03/22/2017. FINDINGS: 1418 hours. The heart size and mediastinal contours are stable. Interval increased diffuse interstitial opacities consistent with pulmonary edema. No confluent airspace disease, pneumothorax or significant pleural effusion. Stable degenerative changes in the spine. No acute osseous abnormalities are identified. IMPRESSION: Interval increased diffuse interstitial opacities consistent with pulmonary edema or volume overload. No confluent airspace disease. Electronically Signed   By: Elsie Perone M.D.   On: 07/29/2024 14:36     PERTINENT LAB RESULTS: CBC: Recent Labs    07/30/24 0831 07/31/24 0035  WBC 6.3 6.7  HGB 7.9* 8.1*  HCT 25.2* 25.7*  PLT 119* 149*    CMET CMP     Component Value Date/Time   NA 135 07/31/2024 0035   K 5.0 07/31/2024 0035   CL 90 (L) 07/31/2024 0035   CO2 28 07/31/2024 0035   GLUCOSE 77 07/31/2024 0035   BUN 66 (H) 07/31/2024 0035   CREATININE 8.73 (H) 07/31/2024 0035   CALCIUM  8.9 07/31/2024 0035   PROT 7.4 07/29/2024 1415   ALBUMIN  4.2 07/30/2024 0452   AST 15 07/29/2024 1415   ALT 8 07/29/2024 1415   ALKPHOS 123 07/29/2024 1415   BILITOT 0.5 07/29/2024 1415   GFRNONAA 5 (L) 07/31/2024 0035    GFR Estimated Creatinine Clearance: 7.6 mL/min (A) (by C-G formula based on SCr of 8.73 mg/dL (H)). Recent Labs    07/29/24 1415  LIPASE 72*  No results for input(s): CKTOTAL, CKMB, CKMBINDEX, TROPONINI in the last 72 hours. Invalid input(s): POCBNP No results for input(s): DDIMER in the last 72 hours. Recent Labs    07/29/24 1635 07/29/24 1953  HGBA1C 7.9* 7.9*   No results for input(s): CHOL, HDL, LDLCALC, TRIG, CHOLHDL, LDLDIRECT in the last 72 hours. No results for input(s): TSH, T4TOTAL, T3FREE, THYROIDAB in the last 72 hours.  Invalid input(s): FREET3 No results for input(s): VITAMINB12, FOLATE, FERRITIN, TIBC, IRON, RETICCTPCT in the last 72 hours. Coags: No results for input(s): INR in the last 72 hours.  Invalid input(s): PT Microbiology: Recent Results (from the past 240 hours)  Resp panel by RT-PCR (RSV, Flu A&B, Covid) Anterior Nasal Swab     Status: None   Collection Time: 07/29/24  2:15 PM   Specimen: Anterior Nasal Swab  Result Value Ref Range Status   SARS Coronavirus 2 by RT PCR NEGATIVE NEGATIVE Final    Comment: (NOTE) SARS-CoV-2 target nucleic acids are NOT DETECTED.  The SARS-CoV-2 RNA is generally detectable in upper respiratory specimens during the acute phase of infection. The lowest concentration of SARS-CoV-2 viral copies this assay can detect is 138 copies/mL. A negative result does not preclude SARS-Cov-2 infection  and should not be used as the sole basis for treatment or other patient management decisions. A negative result may occur with  improper specimen collection/handling, submission of specimen other than nasopharyngeal swab, presence of viral mutation(s) within the areas targeted by this assay, and inadequate number of viral copies(<138 copies/mL). A negative result must be combined with clinical observations, patient history, and epidemiological information. The expected result is Negative.  Fact Sheet for Patients:  bloggercourse.com  Fact Sheet for Healthcare Providers:  seriousbroker.it  This test is no t yet approved or cleared by the [place]  FDA and  has been authorized for detection and/or diagnosis of SARS-CoV-2 by FDA under an Emergency Use Authorization (EUA). This EUA will remain  in effect (meaning this test can be used) for the duration of the COVID-19 declaration under Section 564(b)(1) of the Act, 21 U.S.C.section 360bbb-3(b)(1), unless the authorization is terminated  or revoked sooner.       Influenza A by PCR NEGATIVE NEGATIVE Final   Influenza B by PCR NEGATIVE NEGATIVE Final    Comment: (NOTE) The Xpert Xpress SARS-CoV-2/FLU/RSV plus assay is intended as an aid in the diagnosis of influenza from Nasopharyngeal swab specimens and should not be used as a sole basis for treatment. Nasal washings and aspirates are unacceptable for Xpert Xpress SARS-CoV-2/FLU/RSV testing.  Fact Sheet for Patients: bloggercourse.com  Fact Sheet for Healthcare Providers: seriousbroker.it  This test is not yet approved or cleared by the [place]  FDA and has been authorized for detection and/or diagnosis of SARS-CoV-2 by FDA under an Emergency Use Authorization (EUA). This EUA will remain in effect (meaning this test can be used) for the duration of the COVID-19 declaration  under Section 564(b)(1) of the Act, 21 U.S.C. section 360bbb-3(b)(1), unless the authorization is terminated or revoked.     Resp Syncytial Virus by PCR NEGATIVE NEGATIVE Final    Comment: (NOTE) Fact Sheet for Patients: bloggercourse.com  Fact Sheet for Healthcare Providers: seriousbroker.it  This test is not yet approved or cleared by the [place]  FDA and has been authorized for detection and/or diagnosis of SARS-CoV-2 by FDA under an Emergency Use Authorization (EUA). This EUA will remain in effect (meaning this test can be used) for the duration of the COVID-19 declaration  under Section 564(b)(1) of the Act, 21 U.S.C. section 360bbb-3(b)(1), unless the authorization is terminated or revoked.  Performed at [facility], [address] [place], [place] [address]     FURTHER DISCHARGE INSTRUCTIONS:  Get Medicines reviewed and adjusted: Please take all your medications with you for your next visit with your Primary MD  Laboratory/radiological data: Please request your Primary MD to go over all hospital tests and procedure/radiological results at the follow up, please ask your Primary MD to get all Hospital records sent to his/her office.  In some cases, they will be blood work, cultures and biopsy results pending at the time of your discharge. Please request that your primary care M.D. goes through all the records of your hospital data and follows up on these results.  Also Note the following: If you experience worsening of your admission symptoms, develop shortness of breath, life threatening emergency, suicidal or homicidal thoughts you must seek medical attention immediately by calling 911 or calling your MD immediately  if symptoms less severe.  You must read complete instructions/literature along with all the possible adverse reactions/side effects for all the Medicines you take and that have been prescribed  to you. Take any new Medicines after you have completely understood and accpet all the possible adverse reactions/side effects.   Do not drive when taking Pain medications or sleeping medications (Benzodaizepines)  Do not take more than prescribed Pain, Sleep and Anxiety Medications. It is not advisable to combine anxiety,sleep and pain medications without talking with your primary care practitioner  Special Instructions: If you have smoked or chewed Tobacco  in the last 2 yrs please stop smoking, stop any regular Alcohol  and or any Recreational drug use.  Wear Seat belts while driving.  Please note: You were cared for by a hospitalist during your hospital stay. Once you are discharged, your primary care physician will handle any further medical issues. Please note that NO REFILLS for any discharge medications will be authorized once you are discharged, as it is imperative that you return to your primary care physician (or establish a relationship with a primary care physician if you do not have one) for your post hospital discharge needs so that they can reassess your need for medications and monitor your lab values.  Total Time spent coordinating discharge including counseling, education and face to face time equals greater than 30 minutes.  Signed: Donalda Applebaum 07/31/2024 10:59 AM      [1]  Allergies Allergen Reactions   Lisinopril Cough   "

## 2024-07-31 NOTE — Progress Notes (Signed)
" °   07/31/24 1202  Vitals  Temp 98.3 F (36.8 C)  Temp Source Oral  BP 101/65  BP Location Left Arm  BP Method Automatic  Patient Position (if appropriate) Lying  Pulse Rate 91  Pulse Rate Source Monitor  ECG Heart Rate 92  Resp 14  Weight 92.3 kg  Type of Weight Post-Dialysis  Oxygen Therapy  SpO2 94 %  O2 Device Nasal Cannula  O2 Flow Rate (L/min) 4 L/min  During Treatment Monitoring  HD Safety Checks Performed Yes  Intra-Hemodialysis Comments Tx completed;See progress note (System clotted 29 min left patient did not want t wait until machine reset. blood returned)  Post Treatment  Dialyzer Clearance Heavily streaked  Liters Processed 72  Fluid Removed (mL) 2600 mL  Tolerated HD Treatment Yes  AVG/AVF Arterial Site Held (minutes) 8 minutes  AVG/AVF Venous Site Held (minutes) 6 minutes  Fistula / Graft Left Forearm Arteriovenous fistula  No placement date or time found.   Placed prior to admission: Yes  Orientation: Left  Access Location: Forearm  Access Type: Arteriovenous fistula  Site Condition No complications  Fistula / Graft Assessment Present;Thrill;Bruit  Status Accessed;Deaccessed  Drainage Description None    "
